# Patient Record
Sex: Male | Born: 1945
Health system: Southern US, Community
[De-identification: ages and names within clinical notes are randomized; demographics above are authoritative.]

## PROBLEM LIST (undated history)

## (undated) DIAGNOSIS — E119 Type 2 diabetes mellitus without complications: Secondary | ICD-10-CM

## (undated) DIAGNOSIS — I10 Essential (primary) hypertension: Secondary | ICD-10-CM

---

## 1998-01-22 ENCOUNTER — Ambulatory Visit (HOSPITAL_COMMUNITY): Admission: RE | Admit: 1998-01-22 | Discharge: 1998-01-22 | Payer: Self-pay | Admitting: Gastroenterology

## 2001-05-13 ENCOUNTER — Emergency Department (HOSPITAL_COMMUNITY): Admission: EM | Admit: 2001-05-13 | Discharge: 2001-05-13 | Payer: Self-pay

## 2001-05-14 ENCOUNTER — Emergency Department (HOSPITAL_COMMUNITY): Admission: EM | Admit: 2001-05-14 | Discharge: 2001-05-14 | Payer: Self-pay | Admitting: Emergency Medicine

## 2005-10-14 ENCOUNTER — Ambulatory Visit (HOSPITAL_COMMUNITY): Admission: RE | Admit: 2005-10-14 | Discharge: 2005-10-14 | Payer: Self-pay | Admitting: Gastroenterology

## 2015-03-13 MED FILL — PIOGLITAZONE HCL 30 MG TAB: 30 | 90 days supply | Qty: 90 | Fill #2

## 2015-03-13 MED FILL — VYTORIN 10-20 MG TABLET: 10-20 | 90 days supply | Qty: 90 | Fill #2

## 2015-03-13 MED FILL — LOSARTAN POTASSIUM 100 MG T: 100 | 90 days supply | Qty: 90 | Fill #2

## 2015-04-08 DIAGNOSIS — Z6825 Body mass index (BMI) 25.0-25.9, adult: Secondary | ICD-10-CM | POA: Diagnosis not present

## 2015-04-08 DIAGNOSIS — I10 Essential (primary) hypertension: Secondary | ICD-10-CM | POA: Diagnosis not present

## 2015-04-08 DIAGNOSIS — E784 Other hyperlipidemia: Secondary | ICD-10-CM | POA: Diagnosis not present

## 2015-04-08 DIAGNOSIS — E11319 Type 2 diabetes mellitus with unspecified diabetic retinopathy without macular edema: Secondary | ICD-10-CM | POA: Diagnosis not present

## 2015-04-08 DIAGNOSIS — Z1389 Encounter for screening for other disorder: Secondary | ICD-10-CM | POA: Diagnosis not present

## 2015-06-09 MED FILL — PIOGLITAZONE HCL 30 MG TAB: 30 | 90 days supply | Qty: 90 | Fill #0

## 2015-06-09 MED FILL — LOSARTAN POTASSIUM 100 MG T: 100 | 90 days supply | Qty: 90 | Fill #0

## 2015-06-10 DIAGNOSIS — D485 Neoplasm of uncertain behavior of skin: Secondary | ICD-10-CM | POA: Diagnosis not present

## 2015-06-10 DIAGNOSIS — L570 Actinic keratosis: Secondary | ICD-10-CM | POA: Diagnosis not present

## 2015-06-10 DIAGNOSIS — D1801 Hemangioma of skin and subcutaneous tissue: Secondary | ICD-10-CM | POA: Diagnosis not present

## 2015-06-10 DIAGNOSIS — D225 Melanocytic nevi of trunk: Secondary | ICD-10-CM | POA: Diagnosis not present

## 2015-06-10 DIAGNOSIS — Z85828 Personal history of other malignant neoplasm of skin: Secondary | ICD-10-CM | POA: Diagnosis not present

## 2015-06-10 DIAGNOSIS — D2361 Other benign neoplasm of skin of right upper limb, including shoulder: Secondary | ICD-10-CM | POA: Diagnosis not present

## 2015-06-10 DIAGNOSIS — C4441 Basal cell carcinoma of skin of scalp and neck: Secondary | ICD-10-CM | POA: Diagnosis not present

## 2015-06-10 MED FILL — EZETIMIBE-SIMVASTATIN 10-20: 10-20 | 90 days supply | Qty: 90 | Fill #0

## 2015-07-04 DIAGNOSIS — H1131 Conjunctival hemorrhage, right eye: Secondary | ICD-10-CM | POA: Diagnosis not present

## 2015-07-04 DIAGNOSIS — H11822 Conjunctivochalasis, left eye: Secondary | ICD-10-CM | POA: Diagnosis not present

## 2015-09-09 MED FILL — LOSARTAN POTASSIUM 100 MG T: 100 | 90 days supply | Qty: 90 | Fill #1

## 2015-09-09 MED FILL — EZETIMIBE-SIMVASTATIN 10-20: 10-20 | 90 days supply | Qty: 90 | Fill #1

## 2015-09-09 MED FILL — PIOGLITAZONE HCL 30 MG TAB: 30 | 90 days supply | Qty: 90 | Fill #1

## 2015-09-30 MED FILL — GAVILYTE-N SOLUTION: 420 | 1 days supply | Qty: 4000 | Fill #0

## 2015-10-07 DIAGNOSIS — Z1211 Encounter for screening for malignant neoplasm of colon: Secondary | ICD-10-CM | POA: Diagnosis not present

## 2015-10-14 DIAGNOSIS — R8299 Other abnormal findings in urine: Secondary | ICD-10-CM | POA: Diagnosis not present

## 2015-10-14 DIAGNOSIS — E1129 Type 2 diabetes mellitus with other diabetic kidney complication: Secondary | ICD-10-CM | POA: Diagnosis not present

## 2015-10-14 DIAGNOSIS — Z0000 Encounter for general adult medical examination without abnormal findings: Secondary | ICD-10-CM | POA: Diagnosis not present

## 2015-10-14 DIAGNOSIS — Z125 Encounter for screening for malignant neoplasm of prostate: Secondary | ICD-10-CM | POA: Diagnosis not present

## 2015-10-18 DIAGNOSIS — Z23 Encounter for immunization: Secondary | ICD-10-CM | POA: Diagnosis not present

## 2015-10-21 DIAGNOSIS — E1129 Type 2 diabetes mellitus with other diabetic kidney complication: Secondary | ICD-10-CM | POA: Diagnosis not present

## 2015-10-21 DIAGNOSIS — R3129 Other microscopic hematuria: Secondary | ICD-10-CM | POA: Diagnosis not present

## 2015-10-21 DIAGNOSIS — E784 Other hyperlipidemia: Secondary | ICD-10-CM | POA: Diagnosis not present

## 2015-10-21 DIAGNOSIS — Z0000 Encounter for general adult medical examination without abnormal findings: Secondary | ICD-10-CM | POA: Diagnosis not present

## 2015-10-21 DIAGNOSIS — I10 Essential (primary) hypertension: Secondary | ICD-10-CM | POA: Diagnosis not present

## 2015-10-21 DIAGNOSIS — R972 Elevated prostate specific antigen [PSA]: Secondary | ICD-10-CM | POA: Diagnosis not present

## 2015-10-21 DIAGNOSIS — Z6824 Body mass index (BMI) 24.0-24.9, adult: Secondary | ICD-10-CM | POA: Diagnosis not present

## 2015-10-21 DIAGNOSIS — N182 Chronic kidney disease, stage 2 (mild): Secondary | ICD-10-CM | POA: Diagnosis not present

## 2015-10-21 MED FILL — TELMISARTAN 80 MG TABLET: 80 | 90 days supply | Qty: 90 | Fill #0

## 2015-12-09 DIAGNOSIS — L570 Actinic keratosis: Secondary | ICD-10-CM | POA: Diagnosis not present

## 2015-12-09 DIAGNOSIS — D2272 Melanocytic nevi of left lower limb, including hip: Secondary | ICD-10-CM | POA: Diagnosis not present

## 2015-12-09 DIAGNOSIS — D2361 Other benign neoplasm of skin of right upper limb, including shoulder: Secondary | ICD-10-CM | POA: Diagnosis not present

## 2015-12-09 DIAGNOSIS — D225 Melanocytic nevi of trunk: Secondary | ICD-10-CM | POA: Diagnosis not present

## 2015-12-09 DIAGNOSIS — L821 Other seborrheic keratosis: Secondary | ICD-10-CM | POA: Diagnosis not present

## 2015-12-09 DIAGNOSIS — Z85828 Personal history of other malignant neoplasm of skin: Secondary | ICD-10-CM | POA: Diagnosis not present

## 2015-12-09 DIAGNOSIS — D1801 Hemangioma of skin and subcutaneous tissue: Secondary | ICD-10-CM | POA: Diagnosis not present

## 2015-12-09 DIAGNOSIS — D2372 Other benign neoplasm of skin of left lower limb, including hip: Secondary | ICD-10-CM | POA: Diagnosis not present

## 2015-12-09 MED FILL — EZETIMIBE-SIMVASTATIN 10-20: 10-20 | 90 days supply | Qty: 90 | Fill #2

## 2015-12-09 MED FILL — PIOGLITAZONE HCL 30 MG TAB: 30 | 90 days supply | Qty: 90 | Fill #0

## 2015-12-18 DIAGNOSIS — H52203 Unspecified astigmatism, bilateral: Secondary | ICD-10-CM | POA: Diagnosis not present

## 2015-12-18 DIAGNOSIS — H524 Presbyopia: Secondary | ICD-10-CM | POA: Diagnosis not present

## 2016-01-07 MED FILL — TELMISARTAN 80 MG TABLET: 80 | 90 days supply | Qty: 90 | Fill #1

## 2016-03-14 MED FILL — EZETIMIBE-SIMVASTATIN 10-20: 10-20 | 90 days supply | Qty: 90 | Fill #3

## 2016-03-14 MED FILL — PIOGLITAZONE HCL 30 MG TAB: 30 | 90 days supply | Qty: 90 | Fill #1

## 2016-03-30 MED FILL — TELMISARTAN 80 MG TABLET: 80 | 90 days supply | Qty: 90 | Fill #2

## 2016-06-03 DIAGNOSIS — I10 Essential (primary) hypertension: Secondary | ICD-10-CM | POA: Diagnosis not present

## 2016-06-03 DIAGNOSIS — E1129 Type 2 diabetes mellitus with other diabetic kidney complication: Secondary | ICD-10-CM | POA: Diagnosis not present

## 2016-06-03 DIAGNOSIS — Z1389 Encounter for screening for other disorder: Secondary | ICD-10-CM | POA: Diagnosis not present

## 2016-06-03 DIAGNOSIS — E784 Other hyperlipidemia: Secondary | ICD-10-CM | POA: Diagnosis not present

## 2016-06-03 DIAGNOSIS — Z6825 Body mass index (BMI) 25.0-25.9, adult: Secondary | ICD-10-CM | POA: Diagnosis not present

## 2016-06-07 MED FILL — EZETIMIBE-SIMVASTATIN 10-20: 10-20 | 90 days supply | Qty: 90 | Fill #0

## 2016-06-07 MED FILL — PIOGLITAZONE HCL 30 MG TAB: 30 | 90 days supply | Qty: 90 | Fill #0

## 2016-06-08 DIAGNOSIS — D2372 Other benign neoplasm of skin of left lower limb, including hip: Secondary | ICD-10-CM | POA: Diagnosis not present

## 2016-06-08 DIAGNOSIS — L821 Other seborrheic keratosis: Secondary | ICD-10-CM | POA: Diagnosis not present

## 2016-06-08 DIAGNOSIS — Z85828 Personal history of other malignant neoplasm of skin: Secondary | ICD-10-CM | POA: Diagnosis not present

## 2016-06-08 DIAGNOSIS — C4441 Basal cell carcinoma of skin of scalp and neck: Secondary | ICD-10-CM | POA: Diagnosis not present

## 2016-06-08 DIAGNOSIS — L570 Actinic keratosis: Secondary | ICD-10-CM | POA: Diagnosis not present

## 2016-06-08 DIAGNOSIS — D1801 Hemangioma of skin and subcutaneous tissue: Secondary | ICD-10-CM | POA: Diagnosis not present

## 2016-06-08 DIAGNOSIS — C44212 Basal cell carcinoma of skin of right ear and external auricular canal: Secondary | ICD-10-CM | POA: Diagnosis not present

## 2016-06-08 DIAGNOSIS — D485 Neoplasm of uncertain behavior of skin: Secondary | ICD-10-CM | POA: Diagnosis not present

## 2016-06-10 MED FILL — TELMISARTAN 80 MG TABLET: 80 | 90 days supply | Qty: 90 | Fill #3

## 2016-07-08 DIAGNOSIS — C44212 Basal cell carcinoma of skin of right ear and external auricular canal: Secondary | ICD-10-CM | POA: Diagnosis not present

## 2016-07-08 DIAGNOSIS — Z85828 Personal history of other malignant neoplasm of skin: Secondary | ICD-10-CM | POA: Diagnosis not present

## 2016-07-08 MED FILL — DOXYCYCLINE HYCLATE 100 MG: 100 | 5 days supply | Qty: 10 | Fill #0

## 2016-09-13 MED FILL — PIOGLITAZONE HCL 30 MG TAB: 30 | 90 days supply | Qty: 90 | Fill #1

## 2016-09-13 MED FILL — EZETIMIBE-SIMVASTATIN 10-20: 10-20 | 90 days supply | Qty: 90 | Fill #1

## 2016-09-13 MED FILL — TELMISARTAN 80 MG TABLET: 80 | 90 days supply | Qty: 90 | Fill #0

## 2016-10-02 DIAGNOSIS — Z23 Encounter for immunization: Secondary | ICD-10-CM | POA: Diagnosis not present

## 2016-10-18 DIAGNOSIS — E119 Type 2 diabetes mellitus without complications: Secondary | ICD-10-CM | POA: Diagnosis not present

## 2016-10-18 DIAGNOSIS — I10 Essential (primary) hypertension: Secondary | ICD-10-CM | POA: Diagnosis not present

## 2016-10-18 DIAGNOSIS — Z125 Encounter for screening for malignant neoplasm of prostate: Secondary | ICD-10-CM | POA: Diagnosis not present

## 2016-10-18 DIAGNOSIS — Z0000 Encounter for general adult medical examination without abnormal findings: Secondary | ICD-10-CM | POA: Diagnosis not present

## 2016-10-26 DIAGNOSIS — E7849 Other hyperlipidemia: Secondary | ICD-10-CM | POA: Diagnosis not present

## 2016-10-26 DIAGNOSIS — Z1389 Encounter for screening for other disorder: Secondary | ICD-10-CM | POA: Diagnosis not present

## 2016-10-26 DIAGNOSIS — N182 Chronic kidney disease, stage 2 (mild): Secondary | ICD-10-CM | POA: Diagnosis not present

## 2016-10-26 DIAGNOSIS — Z6825 Body mass index (BMI) 25.0-25.9, adult: Secondary | ICD-10-CM | POA: Diagnosis not present

## 2016-10-26 DIAGNOSIS — R972 Elevated prostate specific antigen [PSA]: Secondary | ICD-10-CM | POA: Diagnosis not present

## 2016-10-26 DIAGNOSIS — I10 Essential (primary) hypertension: Secondary | ICD-10-CM | POA: Diagnosis not present

## 2016-10-26 DIAGNOSIS — Z0000 Encounter for general adult medical examination without abnormal findings: Secondary | ICD-10-CM | POA: Diagnosis not present

## 2016-10-26 DIAGNOSIS — E1129 Type 2 diabetes mellitus with other diabetic kidney complication: Secondary | ICD-10-CM | POA: Diagnosis not present

## 2016-10-26 MED FILL — CIPROFLOXACIN HCL 500 MG TA: 500 | 21 days supply | Qty: 42 | Fill #0

## 2016-10-27 DIAGNOSIS — Z13820 Encounter for screening for osteoporosis: Secondary | ICD-10-CM | POA: Diagnosis not present

## 2016-11-02 DIAGNOSIS — Z1212 Encounter for screening for malignant neoplasm of rectum: Secondary | ICD-10-CM | POA: Diagnosis not present

## 2016-11-15 DIAGNOSIS — R972 Elevated prostate specific antigen [PSA]: Secondary | ICD-10-CM | POA: Diagnosis not present

## 2016-11-22 MED FILL — VIT D2 1.25 MG (50,000 UNIT: 1.25 MG | 28 days supply | Qty: 4 | Fill #0

## 2016-11-24 DIAGNOSIS — H903 Sensorineural hearing loss, bilateral: Secondary | ICD-10-CM | POA: Diagnosis not present

## 2016-11-26 DIAGNOSIS — H903 Sensorineural hearing loss, bilateral: Secondary | ICD-10-CM | POA: Diagnosis not present

## 2016-12-13 MED FILL — PIOGLITAZONE HCL 30 MG TAB: 30 | 90 days supply | Qty: 90 | Fill #2

## 2016-12-13 MED FILL — EZETIMIBE-SIMVASTATIN 10-20: 10-20 | 90 days supply | Qty: 90 | Fill #2

## 2016-12-13 MED FILL — TELMISARTAN 80 MG TABLET: 80 | 60 days supply | Qty: 60 | Fill #1

## 2016-12-14 DIAGNOSIS — D1801 Hemangioma of skin and subcutaneous tissue: Secondary | ICD-10-CM | POA: Diagnosis not present

## 2016-12-14 DIAGNOSIS — C44612 Basal cell carcinoma of skin of right upper limb, including shoulder: Secondary | ICD-10-CM | POA: Diagnosis not present

## 2016-12-14 DIAGNOSIS — L570 Actinic keratosis: Secondary | ICD-10-CM | POA: Diagnosis not present

## 2016-12-14 DIAGNOSIS — D485 Neoplasm of uncertain behavior of skin: Secondary | ICD-10-CM | POA: Diagnosis not present

## 2016-12-14 DIAGNOSIS — D225 Melanocytic nevi of trunk: Secondary | ICD-10-CM | POA: Diagnosis not present

## 2016-12-14 DIAGNOSIS — L821 Other seborrheic keratosis: Secondary | ICD-10-CM | POA: Diagnosis not present

## 2016-12-14 DIAGNOSIS — Z85828 Personal history of other malignant neoplasm of skin: Secondary | ICD-10-CM | POA: Diagnosis not present

## 2016-12-14 DIAGNOSIS — C44519 Basal cell carcinoma of skin of other part of trunk: Secondary | ICD-10-CM | POA: Diagnosis not present

## 2016-12-14 MED FILL — VIT D2 1.25 MG (50,000 UNIT: 1.25 MG | 28 days supply | Qty: 4 | Fill #1

## 2016-12-23 DIAGNOSIS — R972 Elevated prostate specific antigen [PSA]: Secondary | ICD-10-CM | POA: Diagnosis not present

## 2016-12-23 DIAGNOSIS — R3915 Urgency of urination: Secondary | ICD-10-CM | POA: Diagnosis not present

## 2016-12-23 MED FILL — SULFAMETHOXAZOLE/TMP DS TAB: 800-160 | 3 days supply | Qty: 6 | Fill #0

## 2017-01-03 DIAGNOSIS — H52203 Unspecified astigmatism, bilateral: Secondary | ICD-10-CM | POA: Diagnosis not present

## 2017-01-12 MED FILL — VIT D2 1.25 MG (50,000 UNIT: 1.25 MG | 84 days supply | Qty: 12 | Fill #0

## 2017-01-24 DIAGNOSIS — R972 Elevated prostate specific antigen [PSA]: Secondary | ICD-10-CM | POA: Diagnosis not present

## 2017-01-24 DIAGNOSIS — R3915 Urgency of urination: Secondary | ICD-10-CM | POA: Diagnosis not present

## 2017-01-31 MED FILL — FINASTERIDE 5 MG TABLET: 5 | 90 days supply | Qty: 90 | Fill #0

## 2017-03-04 MED FILL — PIOGLITAZONE HCL 30 MG TAB: 30 | 90 days supply | Qty: 90 | Fill #3

## 2017-03-04 MED FILL — TELMISARTAN 80 MG TABS: 80 | 90 days supply | Qty: 90 | Fill #2

## 2017-03-04 MED FILL — EZETIMIBE-SIMVASTATIN 10-20: 10-20 | 90 days supply | Qty: 90 | Fill #3

## 2017-04-06 MED FILL — VIT D2 1.25 MG (50,000 UNIT: 1.25 MG | 84 days supply | Qty: 12 | Fill #1

## 2017-04-25 DIAGNOSIS — E11319 Type 2 diabetes mellitus with unspecified diabetic retinopathy without macular edema: Secondary | ICD-10-CM | POA: Diagnosis not present

## 2017-04-25 DIAGNOSIS — I10 Essential (primary) hypertension: Secondary | ICD-10-CM | POA: Diagnosis not present

## 2017-04-25 DIAGNOSIS — Z1389 Encounter for screening for other disorder: Secondary | ICD-10-CM | POA: Diagnosis not present

## 2017-04-25 DIAGNOSIS — Z6825 Body mass index (BMI) 25.0-25.9, adult: Secondary | ICD-10-CM | POA: Diagnosis not present

## 2017-04-25 DIAGNOSIS — E210 Primary hyperparathyroidism: Secondary | ICD-10-CM | POA: Diagnosis not present

## 2017-04-29 MED FILL — FINASTERIDE 5 MG TABLET: 5 | 90 days supply | Qty: 90 | Fill #1

## 2017-05-30 DIAGNOSIS — E1129 Type 2 diabetes mellitus with other diabetic kidney complication: Secondary | ICD-10-CM | POA: Diagnosis not present

## 2017-06-01 MED FILL — EZETIMIBE-SIMVASTATIN 10-20: 10-20 | 90 days supply | Qty: 90 | Fill #0

## 2017-06-01 MED FILL — TELMISARTAN 80 MG TABS: 80 | 90 days supply | Qty: 90 | Fill #3

## 2017-06-01 MED FILL — PIOGLITAZONE HCL 30 MG TAB: 30 | 90 days supply | Qty: 90 | Fill #0

## 2017-06-02 DIAGNOSIS — Z85828 Personal history of other malignant neoplasm of skin: Secondary | ICD-10-CM | POA: Diagnosis not present

## 2017-06-02 DIAGNOSIS — D2372 Other benign neoplasm of skin of left lower limb, including hip: Secondary | ICD-10-CM | POA: Diagnosis not present

## 2017-06-02 DIAGNOSIS — D485 Neoplasm of uncertain behavior of skin: Secondary | ICD-10-CM | POA: Diagnosis not present

## 2017-06-02 DIAGNOSIS — L821 Other seborrheic keratosis: Secondary | ICD-10-CM | POA: Diagnosis not present

## 2017-06-02 DIAGNOSIS — D2272 Melanocytic nevi of left lower limb, including hip: Secondary | ICD-10-CM | POA: Diagnosis not present

## 2017-06-02 DIAGNOSIS — L570 Actinic keratosis: Secondary | ICD-10-CM | POA: Diagnosis not present

## 2017-06-02 DIAGNOSIS — D225 Melanocytic nevi of trunk: Secondary | ICD-10-CM | POA: Diagnosis not present

## 2017-06-02 DIAGNOSIS — D1801 Hemangioma of skin and subcutaneous tissue: Secondary | ICD-10-CM | POA: Diagnosis not present

## 2017-06-02 MED FILL — IMIQUIMOD 5% CREAM PACKET: 5 | 24 days supply | Qty: 12 | Fill #0

## 2017-06-27 MED FILL — VIT D2 1.25 MG (50,000 UNIT: 1.25 MG | 84 days supply | Qty: 12 | Fill #2

## 2017-07-28 MED FILL — FINASTERIDE 5 MG TABLET: 5 | 90 days supply | Qty: 90 | Fill #2

## 2017-08-17 DIAGNOSIS — E210 Primary hyperparathyroidism: Secondary | ICD-10-CM | POA: Diagnosis not present

## 2017-08-17 DIAGNOSIS — E559 Vitamin D deficiency, unspecified: Secondary | ICD-10-CM | POA: Diagnosis not present

## 2017-08-18 ENCOUNTER — Other Ambulatory Visit (HOSPITAL_COMMUNITY): Payer: Self-pay | Admitting: Surgery

## 2017-08-18 DIAGNOSIS — E210 Primary hyperparathyroidism: Secondary | ICD-10-CM

## 2017-08-18 DIAGNOSIS — E559 Vitamin D deficiency, unspecified: Secondary | ICD-10-CM

## 2017-08-31 ENCOUNTER — Encounter (HOSPITAL_COMMUNITY)
Admission: RE | Admit: 2017-08-31 | Discharge: 2017-08-31 | Disposition: A | Discharge status: Home / Self Care | Payer: 59 | Source: Ambulatory Visit | Attending: Surgery | Admitting: Surgery

## 2017-08-31 ENCOUNTER — Ambulatory Visit (HOSPITAL_COMMUNITY)
Admission: RE | Admit: 2017-08-31 | Discharge: 2017-08-31 | Disposition: A | Discharge status: Home / Self Care | Payer: 59 | Source: Ambulatory Visit | Attending: Surgery | Admitting: Surgery

## 2017-08-31 DIAGNOSIS — E210 Primary hyperparathyroidism: Secondary | ICD-10-CM | POA: Insufficient documentation

## 2017-08-31 DIAGNOSIS — E213 Hyperparathyroidism, unspecified: Secondary | ICD-10-CM | POA: Diagnosis not present

## 2017-08-31 DIAGNOSIS — E559 Vitamin D deficiency, unspecified: Secondary | ICD-10-CM

## 2017-08-31 DIAGNOSIS — E042 Nontoxic multinodular goiter: Secondary | ICD-10-CM | POA: Diagnosis not present

## 2017-08-31 MED ORDER — TECHNETIUM TC 99M SESTAMIBI - CARDIOLITE
25.0000 | Freq: Once | INTRAVENOUS | Status: AC | PRN
  Administered 2017-08-31: 25 via INTRAVENOUS

## 2017-09-06 MED FILL — EZETIMIBE-SIMVASTATIN 10-20: 10-20 | 90 days supply | Qty: 90 | Fill #1

## 2017-09-06 MED FILL — PIOGLITAZONE HCL 30 MG TAB: 30 | 90 days supply | Qty: 90 | Fill #1

## 2017-09-06 MED FILL — TELMISARTAN 80 MG TABS: 80 | 90 days supply | Qty: 90 | Fill #0

## 2017-09-07 MED FILL — VIT D2 1.25 MG (50,000 UNIT: 1.25 MG | 84 days supply | Qty: 12 | Fill #3

## 2017-09-08 ENCOUNTER — Other Ambulatory Visit: Payer: Self-pay | Admitting: Surgery

## 2017-09-08 DIAGNOSIS — E041 Nontoxic single thyroid nodule: Secondary | ICD-10-CM

## 2017-09-15 ENCOUNTER — Ambulatory Visit
Admission: RE | Admit: 2017-09-15 | Discharge: 2017-09-15 | Disposition: A | Discharge status: Home / Self Care | Payer: 59 | Source: Ambulatory Visit | Attending: Surgery | Admitting: Surgery

## 2017-09-15 ENCOUNTER — Other Ambulatory Visit (HOSPITAL_COMMUNITY)
Admission: RE | Admit: 2017-09-15 | Discharge: 2017-09-15 | Disposition: A | Discharge status: Home / Self Care | Payer: 59 | Source: Ambulatory Visit | Attending: Radiology | Admitting: Radiology

## 2017-09-15 DIAGNOSIS — E041 Nontoxic single thyroid nodule: Secondary | ICD-10-CM

## 2017-09-29 ENCOUNTER — Ambulatory Visit: Payer: Self-pay | Admitting: Surgery

## 2017-10-01 DIAGNOSIS — Z23 Encounter for immunization: Secondary | ICD-10-CM | POA: Diagnosis not present

## 2017-10-12 MED FILL — FINASTERIDE 5 MG TABLET: 5 | 90 days supply | Qty: 90 | Fill #3

## 2017-10-26 DIAGNOSIS — E210 Primary hyperparathyroidism: Secondary | ICD-10-CM | POA: Diagnosis not present

## 2017-11-06 ENCOUNTER — Encounter (HOSPITAL_COMMUNITY): Payer: Self-pay | Admitting: Surgery

## 2017-11-06 DIAGNOSIS — E210 Primary hyperparathyroidism: Secondary | ICD-10-CM | POA: Diagnosis present

## 2017-11-06 NOTE — H&P (Signed)
General Surgery [facility]  Chrystine Oiler Documented: 10/26/2017 11:25 AM Location: [facility] Patient #: [id number] DOB: Jan 18, 1946 Married / Language: Cleophus Molt / Race: White Male   History of Present Illness Earnstine Regal MD; 10/26/2017 11:56 AM) The patient is a 72 year old male who presents with primary hyperparathyroidism.  CHIEF COMPLAINT: primary hyperparathyroidism  Patient returns to the office to review his test results and to discuss surgery. Patient underwent thyroid ultrasound which demonstrated a 1 cm thyroid nodule in the left thyroid lobe. Biopsy was recommended. Biopsy was subsequently performed and showed a benign follicular nodule. 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 We plan right inferior minimally invasive parathyroidectomy. Patient has been on the Internet and has been reviewing various websites including the surgical group in [place]. He has many questions today to discuss with me.   Problem List/Past Medical Earnstine Regal, MD; 10/26/2017 11:58 AM) VITAMIN D INSUFFICIENCY (E55.9)  PRIMARY HYPERPARATHYROIDISM (E21.0)   Past Surgical History Earnstine Regal, MD; 10/26/2017 11:58 AM) No pertinent past surgical history   Diagnostic Studies History Earnstine Regal, MD; 10/26/2017 11:58 AM) Colonoscopy  1-5 years ago  Allergies Mammie Lorenzo, LPN; 15/04/84 76:19 AM) No Known Drug Allergies [08/17/2017]:  Medication History Mammie Lorenzo, LPN; 50/09/3265 12:45 AM) Telmisartan (80MG  Tablet, Oral) Active. Vitamin D (Ergocalciferol) (50000UNIT Capsule, Oral) Active. Vytorin (10-20MG  Tablet, Oral) Active. Actos (30MG  Tablet, Oral) Active. Aspirin (81MG  Tablet, Oral) Active. Medications Reconciled  Other Problems Earnstine Regal, MD; 10/26/2017 11:58 AM) Diabetes Mellitus  High blood pressure  Hypercholesterolemia   Vitals Claiborne Billings Dockery  LPN; 80/09/9831 82:50 AM) 10/26/2017 11:25 AM Weight: 175.8 lb Height: 68in Body Surface Area: 1.93 m Body Mass Index: 26.73 kg/m  Temp.: 97.49F(Temporal)  BP: 118/68 (Sitting, Left Arm, Standard)       Physical Exam Earnstine Regal MD; 10/26/2017 11:56 AM) The physical exam findings are as follows: Note:Limited examination  Anterior neck is soft and symmetric. No palpable abnormalities. No palpable thyroid nodules. No tenderness. No sign of complication from fine-needle aspiration biopsy.    Assessment & Plan Earnstine Regal MD; 10/26/2017 11:58 AM) PRIMARY HYPERPARATHYROIDISM (E21.0) Current Plans Instructed to keep surgical appointment as scheduled  The patient and I reviewed all of his questions today in detail. We reviewed the results of his fine-needle aspiration biopsy and also of his nuclear medicine parathyroid scan. I provided him with a copy of his biopsy report.  I plan right inferior minimally invasive parathyroidectomy. We will not explore the other parathyroid glands if we find a parathyroid adenoma in the right inferior position as suggested by the nuclear medicine scan. We will plan for outpatient surgery. We will test his PTH level and calcium level approximately 3 weeks following surgery to make sure they have returned to the normal range. There is a 95% chance that the patient has single gland disease.  Patient will proceed with surgery later this month.  Armandina Gemma, [facility] Office: [phone]

## 2017-11-07 NOTE — Patient Instructions (Addendum)
Kenneth Chavez  11/07/2017   Your procedure is scheduled on: 11-10-17     Report to [facility] Main  Entrance    Report to Admitting at 6:30 AM    Call this number if you have problems the morning of surgery [phone]     Remember: Do not eat food or drink liquids :After Midnight.      BRUSH YOUR TEETH MORNING OF SURGERY AND RINSE YOUR MOUTH OUT, NO CHEWING GUM CANDY OR MINTS.     Take these medicines the morning of surgery with A SIP OF WATER: Vytorin (Ezetimibe-Simvastatin)   DO NOT TAKE ANY DIABETIC MEDICATIONS DAY OF YOUR SURGERY                               You may not have any metal on your body including hair pins and              piercings  Do not wear jewelry, lotions, powders, cologne, or deodorant             Men may shave face and neck.   Do not bring valuables to the hospital. [place].  Contacts, dentures or bridgework may not be worn into surgery.      Patients discharged the day of surgery will not be allowed to drive home.  Name and phone number of your driver:Kenneth Chavez [phone]  Special Instructions: N/A              Please read over the following fact sheets you were given: _____________________________________________________________________ How to Manage Your Diabetes Before and After Surgery  Why is it important to control my blood sugar before and after surgery? . Improving blood sugar levels before and after surgery helps healing and can limit problems. . A way of improving blood sugar control is eating a healthy diet by: o  Eating less sugar and carbohydrates o  Increasing activity/exercise o  Talking with your doctor about reaching your blood sugar goals . High blood sugars (greater than 180 mg/dL) can raise your risk of infections and slow your recovery, so you will need to focus on controlling your diabetes during the weeks before surgery. . Make sure  that the doctor who takes care of your diabetes knows about your planned surgery including the date and location.  How do I manage my blood sugar before surgery? . Check your blood sugar at least 4 times a day, starting 2 days before surgery, to make sure that the level is not too high or low. o Check your blood sugar the morning of your surgery when you wake up and every 2 hours until you get to the Short Stay unit. . If your blood sugar is less than 70 mg/dL, you will need to treat for low blood sugar: o Do not take insulin. o Treat a low blood sugar (less than 70 mg/dL) with  cup of clear juice (cranberry or apple), 4 glucose tablets, OR glucose gel. o Recheck blood sugar in 15 minutes after treatment (to make sure it is greater than 70 mg/dL). If your blood sugar is not greater than 70 mg/dL on recheck, call [phone] for further instructions. . Report your blood sugar to the short stay nurse  when you get to Short Stay.  . If you are admitted to the hospital after surgery: o Your blood sugar will be checked by the staff and you will probably be given insulin after surgery (instead of oral diabetes medicines) to make sure you have good blood sugar levels. o The goal for blood sugar control after surgery is 80-180 mg/dL.   WHAT DO I DO ABOUT MY DIABETES MEDICATION?  Marland Kitchen Do not take oral diabetes medicines (pills) the morning of surgery.  . THE DAY BEFORE SURGERY, take your usual dose of Actos                 [facility] - Preparing for Surgery Before surgery, you can play an important role.  Because skin is not sterile, your skin needs to be as free of germs as possible.  You can reduce the number of germs on your skin by washing with CHG (chlorahexidine gluconate) soap before surgery.  CHG is an antiseptic cleaner which kills germs and bonds with the skin to continue killing germs even after washing. Please DO NOT use if you have an allergy to CHG or antibacterial soaps.  If your skin  becomes reddened/irritated stop using the CHG and inform your nurse when you arrive at Short Stay. Do not shave (including legs and underarms) for at least 48 hours prior to the first CHG shower.  You may shave your face/neck. Please follow these instructions carefully:  1.  Shower with CHG Soap the night before surgery and the  morning of Surgery.  2.  If you choose to wash your hair, wash your hair first as usual with your  normal  shampoo.  3.  After you shampoo, rinse your hair and body thoroughly to remove the  shampoo.                           4.  Use CHG as you would any other liquid soap.  You can apply chg directly  to the skin and wash                       Gently with a scrungie or clean washcloth.  5.  Apply the CHG Soap to your body ONLY FROM THE NECK DOWN.   Do not use on face/ open                           Wound or open sores. Avoid contact with eyes, ears mouth and genitals (private parts).                       Wash face,  Genitals (private parts) with your normal soap.             6.  Wash thoroughly, paying special attention to the area where your surgery  will be performed.  7.  Thoroughly rinse your body with warm water from the neck down.  8.  DO NOT shower/wash with your normal soap after using and rinsing off  the CHG Soap.                9.  Pat yourself dry with a clean towel.            10.  Wear clean pajamas.            11.  Place clean sheets on your bed the  night of your first shower and do not  sleep with pets. Day of Surgery : Do not apply any lotions/deodorants the morning of surgery.  Please wear clean clothes to the hospital/surgery center.  FAILURE TO FOLLOW THESE INSTRUCTIONS MAY RESULT IN THE CANCELLATION OF YOUR SURGERY PATIENT SIGNATURE_________________________________  NURSE SIGNATURE__________________________________  ________________________________________________________________________

## 2017-11-08 ENCOUNTER — Encounter (HOSPITAL_COMMUNITY): Payer: Self-pay

## 2017-11-08 ENCOUNTER — Ambulatory Visit (HOSPITAL_COMMUNITY)
Admission: RE | Admit: 2017-11-08 | Discharge: 2017-11-08 | Disposition: A | Discharge status: Home / Self Care | Payer: 59 | Source: Ambulatory Visit | Attending: Anesthesiology | Admitting: Anesthesiology

## 2017-11-08 ENCOUNTER — Encounter (HOSPITAL_COMMUNITY)
Admission: RE | Admit: 2017-11-08 | Discharge: 2017-11-08 | Disposition: A | Discharge status: Home / Self Care | Payer: 59 | Source: Ambulatory Visit | Attending: Surgery | Admitting: Surgery

## 2017-11-08 ENCOUNTER — Other Ambulatory Visit: Payer: Self-pay

## 2017-11-08 DIAGNOSIS — E119 Type 2 diabetes mellitus without complications: Secondary | ICD-10-CM | POA: Diagnosis not present

## 2017-11-08 DIAGNOSIS — Z01818 Encounter for other preprocedural examination: Secondary | ICD-10-CM | POA: Diagnosis not present

## 2017-11-08 HISTORY — DX: Type 2 diabetes mellitus without complications: E11.9

## 2017-11-08 HISTORY — DX: Essential (primary) hypertension: I10

## 2017-11-08 LAB — CBC
HEMATOCRIT: 46.2 % (ref 39.0–52.0)
Hemoglobin: 14.1 g/dL (ref 13.0–17.0)
MCH: 29.3 pg (ref 26.0–34.0)
MCHC: 30.5 g/dL (ref 30.0–36.0)
MCV: 96 fL (ref 80.0–100.0)
PLATELETS: 128 10*3/uL — AB (ref 150–400)
RBC: 4.81 MIL/uL (ref 4.22–5.81)
RDW: 14.2 % (ref 11.5–15.5)
WBC: 6.3 10*3/uL (ref 4.0–10.5)
nRBC: 0 % (ref 0.0–0.2)

## 2017-11-08 LAB — BASIC METABOLIC PANEL
Anion gap: 6 (ref 5–15)
BUN: 24 mg/dL — AB (ref 8–23)
CALCIUM: 10.4 mg/dL — AB (ref 8.9–10.3)
CHLORIDE: 106 mmol/L (ref 98–111)
CO2: 27 mmol/L (ref 22–32)
CREATININE: 0.93 mg/dL (ref 0.61–1.24)
GFR calc Af Amer: >60 mL/min (ref >60)
GFR calc non Af Amer: >60 mL/min (ref >60)
GLUCOSE: 106 mg/dL — AB (ref 70–99)
Potassium: 5.1 mmol/L (ref 3.5–5.1)
Sodium: 139 mmol/L (ref 135–145)

## 2017-11-08 LAB — GLUCOSE, CAPILLARY: Glucose-Capillary: 91 mg/dL (ref 70–99)

## 2017-11-08 NOTE — Progress Notes (Signed)
11-08-17 CBC and BMP results routed to Dr. Harlow Asa for review

## 2017-11-09 ENCOUNTER — Encounter (HOSPITAL_COMMUNITY): Payer: Self-pay | Admitting: Anesthesiology

## 2017-11-09 DIAGNOSIS — Z125 Encounter for screening for malignant neoplasm of prostate: Secondary | ICD-10-CM | POA: Diagnosis not present

## 2017-11-09 DIAGNOSIS — E1129 Type 2 diabetes mellitus with other diabetic kidney complication: Secondary | ICD-10-CM | POA: Diagnosis not present

## 2017-11-09 DIAGNOSIS — Z0000 Encounter for general adult medical examination without abnormal findings: Secondary | ICD-10-CM | POA: Diagnosis not present

## 2017-11-09 DIAGNOSIS — R82998 Other abnormal findings in urine: Secondary | ICD-10-CM | POA: Diagnosis not present

## 2017-11-09 LAB — HEMOGLOBIN A1C
HEMOGLOBIN A1C: 6.5 % — AB (ref 4.8–5.6)
Mean Plasma Glucose: 140 mg/dL

## 2017-11-09 NOTE — Anesthesia Preprocedure Evaluation (Addendum)
Anesthesia Evaluation  Patient identified by MRN, date of birth, ID band Patient awake    Reviewed: Allergy & Precautions, NPO status , Patient's Chart, lab work & pertinent test results  Airway Mallampati: II  TM Distance: >3 FB Neck ROM: Full    Dental no notable dental hx. (+) Teeth Intact, Caps   Pulmonary neg pulmonary ROS,    Pulmonary exam normal breath sounds clear to auscultation       Cardiovascular hypertension, Pt. on medications Normal cardiovascular exam Rhythm:Regular Rate:Normal     Neuro/Psych negative neurological ROS  negative psych ROS   GI/Hepatic negative GI ROS, Neg liver ROS,   Endo/Other  diabetes, Well Controlled, Type 2, Oral Hypoglycemic AgentsPrimary hyperparathyroidism Hyperlipidemia  Renal/GU negative Renal ROS  negative genitourinary   Musculoskeletal negative musculoskeletal ROS (+)   Abdominal   Peds  Hematology   Anesthesia Other Findings   Reproductive/Obstetrics                            Anesthesia Physical Anesthesia Plan  ASA: II  Anesthesia Plan: General   Post-op Pain Management:    Induction:   PONV Risk Score and Plan: 3 and Ondansetron, Dexamethasone and Treatment may vary due to age or medical condition  Airway Management Planned: Oral ETT  Additional Equipment:   Intra-op Plan:   Post-operative Plan: Extubation in OR  Informed Consent: I have reviewed the patients History and Physical, chart, labs and discussed the procedure including the risks, benefits and alternatives for the proposed anesthesia with the patient or authorized representative who has indicated his/her understanding and acceptance.   Dental advisory given  Plan Discussed with: CRNA and Surgeon  Anesthesia Plan Comments: (No versed)       Anesthesia Quick Evaluation

## 2017-11-10 ENCOUNTER — Encounter (HOSPITAL_COMMUNITY): Payer: Self-pay | Admitting: Emergency Medicine

## 2017-11-10 ENCOUNTER — Ambulatory Visit (HOSPITAL_COMMUNITY)
Admission: RE | Admit: 2017-11-10 | Discharge: 2017-11-10 | Disposition: A | Discharge status: Home / Self Care | Payer: 59 | Source: Other Acute Inpatient Hospital | Attending: Surgery | Admitting: Surgery

## 2017-11-10 ENCOUNTER — Encounter (HOSPITAL_COMMUNITY)
Admission: RE | Disposition: A | Discharge status: Home / Self Care | Payer: Self-pay | Source: Other Acute Inpatient Hospital | Attending: Surgery

## 2017-11-10 ENCOUNTER — Ambulatory Visit (HOSPITAL_COMMUNITY): Payer: 59 | Admitting: Anesthesiology

## 2017-11-10 DIAGNOSIS — E559 Vitamin D deficiency, unspecified: Secondary | ICD-10-CM | POA: Diagnosis not present

## 2017-11-10 DIAGNOSIS — E210 Primary hyperparathyroidism: Secondary | ICD-10-CM | POA: Insufficient documentation

## 2017-11-10 DIAGNOSIS — Z7984 Long term (current) use of oral hypoglycemic drugs: Secondary | ICD-10-CM | POA: Diagnosis not present

## 2017-11-10 DIAGNOSIS — E785 Hyperlipidemia, unspecified: Secondary | ICD-10-CM | POA: Diagnosis not present

## 2017-11-10 DIAGNOSIS — I10 Essential (primary) hypertension: Secondary | ICD-10-CM | POA: Diagnosis not present

## 2017-11-10 DIAGNOSIS — E119 Type 2 diabetes mellitus without complications: Secondary | ICD-10-CM | POA: Diagnosis not present

## 2017-11-10 DIAGNOSIS — D351 Benign neoplasm of parathyroid gland: Secondary | ICD-10-CM | POA: Diagnosis not present

## 2017-11-10 DIAGNOSIS — E7800 Pure hypercholesterolemia, unspecified: Secondary | ICD-10-CM | POA: Insufficient documentation

## 2017-11-10 HISTORY — PX: PARATHYROIDECTOMY: SHX19

## 2017-11-10 LAB — GLUCOSE, CAPILLARY
Glucose-Capillary: 125 mg/dL — ABNORMAL HIGH (ref 70–99)
Glucose-Capillary: 135 mg/dL — ABNORMAL HIGH (ref 70–99)

## 2017-11-10 SURGERY — PARATHYROIDECTOMY
Anesthesia: General | Laterality: Right

## 2017-11-10 MED ORDER — LACTATED RINGERS IV SOLN
INTRAVENOUS | Status: DC
  Administered 2017-11-10: 07:00:00 via INTRAVENOUS

## 2017-11-10 MED ORDER — PROPOFOL 10 MG/ML IV BOLUS
INTRAVENOUS | Status: AC
  Filled 2017-11-10: qty 20

## 2017-11-10 MED ORDER — SUCCINYLCHOLINE CHLORIDE 200 MG/10ML IV SOSY
PREFILLED_SYRINGE | INTRAVENOUS | Status: AC
  Filled 2017-11-10: qty 10

## 2017-11-10 MED ORDER — ROCURONIUM BROMIDE 100 MG/10ML IV SOLN
INTRAVENOUS | Status: AC
  Filled 2017-11-10: qty 1

## 2017-11-10 MED ORDER — DEXAMETHASONE SODIUM PHOSPHATE 10 MG/ML IJ SOLN
INTRAMUSCULAR | Status: DC | PRN
  Administered 2017-11-10: 10 mg via INTRAVENOUS

## 2017-11-10 MED ORDER — LIDOCAINE 2% (20 MG/ML) 5 ML SYRINGE
INTRAMUSCULAR | Status: DC | PRN
  Administered 2017-11-10: 80 mg via INTRAVENOUS

## 2017-11-10 MED ORDER — PROPOFOL 10 MG/ML IV BOLUS
INTRAVENOUS | Status: DC | PRN
  Administered 2017-11-10: 150 mg via INTRAVENOUS

## 2017-11-10 MED ORDER — EPHEDRINE 5 MG/ML INJ
INTRAVENOUS | Status: AC
  Filled 2017-11-10: qty 10

## 2017-11-10 MED ORDER — 0.9 % SODIUM CHLORIDE (POUR BTL) OPTIME
TOPICAL | Status: DC | PRN
  Administered 2017-11-10: 1000 mL

## 2017-11-10 MED ORDER — CEFAZOLIN SODIUM-DEXTROSE 2-4 GM/100ML-% IV SOLN
2.0000 g | INTRAVENOUS | Status: AC
  Administered 2017-11-10: 2 g via INTRAVENOUS
  Filled 2017-11-10: qty 100

## 2017-11-10 MED ORDER — SUGAMMADEX SODIUM 200 MG/2ML IV SOLN
INTRAVENOUS | Status: DC | PRN
  Administered 2017-11-10: 200 mg via INTRAVENOUS

## 2017-11-10 MED ORDER — FENTANYL CITRATE (PF) 100 MCG/2ML IJ SOLN
INTRAMUSCULAR | Status: DC | PRN
  Administered 2017-11-10: 100 ug via INTRAVENOUS
  Administered 2017-11-10: 50 ug via INTRAVENOUS

## 2017-11-10 MED ORDER — ONDANSETRON HCL 4 MG/2ML IJ SOLN: 4.0000 mg | Freq: Once | INTRAMUSCULAR | Status: DC | PRN

## 2017-11-10 MED ORDER — HYDROCODONE-ACETAMINOPHEN 7.5-325 MG PO TABS: 1.0000 | ORAL_TABLET | Freq: Once | ORAL | Status: DC | PRN

## 2017-11-10 MED ORDER — BUPIVACAINE HCL 0.25 % IJ SOLN
INTRAMUSCULAR | Status: DC | PRN
  Administered 2017-11-10: 10 mL

## 2017-11-10 MED ORDER — ONDANSETRON HCL 4 MG/2ML IJ SOLN
INTRAMUSCULAR | Status: DC | PRN
  Administered 2017-11-10: 4 mg via INTRAVENOUS

## 2017-11-10 MED ORDER — SUGAMMADEX SODIUM 200 MG/2ML IV SOLN
INTRAVENOUS | Status: AC
  Filled 2017-11-10: qty 2

## 2017-11-10 MED ORDER — FENTANYL CITRATE (PF) 100 MCG/2ML IJ SOLN: 25.0000 ug | INTRAMUSCULAR | Status: DC | PRN

## 2017-11-10 MED ORDER — CHLORHEXIDINE GLUCONATE CLOTH 2 % EX PADS: 6.0000 | MEDICATED_PAD | Freq: Once | CUTANEOUS | Status: DC

## 2017-11-10 MED ORDER — TRAMADOL HCL 50 MG PO TABS: 50.0000 mg | ORAL_TABLET | Freq: Four times a day (QID) | ORAL | 0 refills | Status: DC | PRN

## 2017-11-10 MED ORDER — LIDOCAINE 2% (20 MG/ML) 5 ML SYRINGE
INTRAMUSCULAR | Status: AC
  Filled 2017-11-10: qty 5

## 2017-11-10 MED ORDER — MEPERIDINE HCL 50 MG/ML IJ SOLN: 6.2500 mg | INTRAMUSCULAR | Status: DC | PRN

## 2017-11-10 MED ORDER — BUPIVACAINE HCL (PF) 0.25 % IJ SOLN
INTRAMUSCULAR | Status: AC
  Filled 2017-11-10: qty 30

## 2017-11-10 MED ORDER — EPHEDRINE SULFATE-NACL 50-0.9 MG/10ML-% IV SOSY
PREFILLED_SYRINGE | INTRAVENOUS | Status: DC | PRN
  Administered 2017-11-10 (×3): 5 mg via INTRAVENOUS

## 2017-11-10 MED ORDER — ROCURONIUM BROMIDE 50 MG/5ML IV SOSY
PREFILLED_SYRINGE | INTRAVENOUS | Status: DC | PRN
  Administered 2017-11-10: 50 mg via INTRAVENOUS

## 2017-11-10 MED ORDER — FENTANYL CITRATE (PF) 250 MCG/5ML IJ SOLN
INTRAMUSCULAR | Status: AC
  Filled 2017-11-10: qty 5

## 2017-11-10 MED ORDER — ONDANSETRON HCL 4 MG/2ML IJ SOLN
INTRAMUSCULAR | Status: AC
  Filled 2017-11-10: qty 2

## 2017-11-10 MED ORDER — DEXAMETHASONE SODIUM PHOSPHATE 10 MG/ML IJ SOLN
INTRAMUSCULAR | Status: AC
  Filled 2017-11-10: qty 1

## 2017-11-10 SURGICAL SUPPLY — 42 items
ADH SKN CLS APL DERMABOND .7 (GAUZE/BANDAGES/DRESSINGS) ×1
ATTRACTOMAT 16X20 MAGNETIC DRP (DRAPES) ×2 IMPLANT
BLADE SURG 15 STRL LF DISP TIS (BLADE) ×1 IMPLANT
BLADE SURG 15 STRL SS (BLADE) ×2
CHLORAPREP W/TINT 26ML (MISCELLANEOUS) ×2 IMPLANT
CLIP VESOCCLUDE MED 6/CT (CLIP) ×4 IMPLANT
CLIP VESOCCLUDE SM WIDE 6/CT (CLIP) ×4 IMPLANT
COVER SURGICAL LIGHT HANDLE (MISCELLANEOUS) ×2 IMPLANT
COVER WAND RF STERILE (DRAPES) ×1 IMPLANT
DERMABOND ADVANCED (GAUZE/BANDAGES/DRESSINGS) ×1
DERMABOND ADVANCED .7 DNX12 (GAUZE/BANDAGES/DRESSINGS) IMPLANT
DRAPE LAPAROTOMY T 98X78 PEDS (DRAPES) ×2 IMPLANT
ELECT PENCIL ROCKER SW 15FT (MISCELLANEOUS) ×2 IMPLANT
ELECT REM PT RETURN 15FT ADLT (MISCELLANEOUS) ×2 IMPLANT
GAUZE 4X4 16PLY RFD (DISPOSABLE) ×2 IMPLANT
GLOVE BIOGEL PI IND STRL 7.0 (GLOVE) IMPLANT
GLOVE BIOGEL PI IND STRL 7.5 (GLOVE) IMPLANT
GLOVE BIOGEL PI INDICATOR 7.0 (GLOVE) ×1
GLOVE BIOGEL PI INDICATOR 7.5 (GLOVE) ×3
GLOVE SURG ORTHO 8.0 STRL STRW (GLOVE) ×2 IMPLANT
GLOVE SURG SS PI 7.0 STRL IVOR (GLOVE) ×1 IMPLANT
GOWN SPEC L4 XLG W/TWL (GOWN DISPOSABLE) ×1 IMPLANT
GOWN STRL REUS W/TWL LRG LVL3 (GOWN DISPOSABLE) ×1 IMPLANT
GOWN STRL REUS W/TWL XL LVL3 (GOWN DISPOSABLE) ×6 IMPLANT
HEMOSTAT SURGICEL 2X4 FIBR (HEMOSTASIS) ×1 IMPLANT
ILLUMINATOR WAVEGUIDE N/F (MISCELLANEOUS) IMPLANT
KIT BASIN OR (CUSTOM PROCEDURE TRAY) ×2 IMPLANT
LIGHT WAVEGUIDE WIDE FLAT (MISCELLANEOUS) IMPLANT
NDL HYPO 25X1 1.5 SAFETY (NEEDLE) ×1 IMPLANT
NEEDLE HYPO 25X1 1.5 SAFETY (NEEDLE) ×2 IMPLANT
PACK BASIC VI WITH GOWN DISP (CUSTOM PROCEDURE TRAY) ×2 IMPLANT
POWDER SURGICEL 3.0 GRAM (HEMOSTASIS) IMPLANT
STRIP CLOSURE SKIN 1/2X4 (GAUZE/BANDAGES/DRESSINGS) IMPLANT
SUT MNCRL AB 4-0 PS2 18 (SUTURE) ×2 IMPLANT
SUT SILK 2 0 (SUTURE)
SUT SILK 2-0 18XBRD TIE 12 (SUTURE) IMPLANT
SUT VIC AB 3-0 SH 18 (SUTURE) ×2 IMPLANT
SYR BULB IRRIGATION 50ML (SYRINGE) ×2 IMPLANT
SYR CONTROL 10ML LL (SYRINGE) ×2 IMPLANT
TOWEL OR 17X26 10 PK STRL BLUE (TOWEL DISPOSABLE) ×2 IMPLANT
TOWEL OR NON WOVEN STRL DISP B (DISPOSABLE) ×2 IMPLANT
YANKAUER SUCT BULB TIP 10FT TU (MISCELLANEOUS) ×2 IMPLANT

## 2017-11-10 NOTE — Discharge Instructions (Signed)
General Anesthesia, Adult, Care After These instructions provide you with information about caring for yourself after your procedure. Your health care provider may also give you more specific instructions. Your treatment has been planned according to current medical practices, but problems sometimes occur. Call your health care provider if you have any problems or questions after your procedure. What can I expect after the procedure? After the procedure, it is common to have:  Vomiting.  A sore throat.  Mental slowness.  It is common to feel:  Nauseous.  Cold or shivery.  Sleepy.  Tired.  Sore or achy, even in parts of your body where you did not have surgery.  Follow these instructions at home: For at least 24 hours after the procedure:  Do not: ? Participate in activities where you could fall or become injured. ? Drive. ? Use heavy machinery. ? Drink alcohol. ? Take sleeping pills or medicines that cause drowsiness. ? Make important decisions or sign legal documents. ? Take care of children on your own.  Rest. Eating and drinking  If you vomit, drink water, juice, or soup when you can drink without vomiting.  Drink enough fluid to keep your urine clear or pale yellow.  Make sure you have little or no nausea before eating solid foods.  Follow the diet recommended by your health care provider. General instructions  Have a responsible adult stay with you until you are awake and alert.  Return to your normal activities as told by your health care provider. Ask your health care provider what activities are safe for you.  Take over-the-counter and prescription medicines only as told by your health care provider.  If you smoke, do not smoke without supervision.  Keep all follow-up visits as told by your health care provider. This is important. Contact a health care provider if:  You continue to have nausea or vomiting at home, and medicines are not helpful.  You  cannot drink fluids or start eating again.  You cannot urinate after 8-12 hours.  You develop a skin rash.  You have fever.  You have increasing redness at the site of your procedure. Get help right away if:  You have difficulty breathing.  You have chest pain.  You have unexpected bleeding.  You feel that you are having a life-threatening or urgent problem. This information is not intended to replace advice given to you by your health care provider. Make sure you discuss any questions you have with your health care provider. Document Released: 04/12/2000 Document Revised: 06/09/2015 Document Reviewed: 12/19/2014 Elsevier Interactive Patient Education  2018 [place].     Parathyroidectomy, Care After Refer to this sheet in the next few weeks. These instructions provide you with information on caring for yourself after your procedure. Your health care provider may also give you more specific instructions. Your treatment has been planned according to current medical practices, but problems sometimes occur. Call your health care provider if you have any problems or questions after your procedure. What can I expect after the procedure? After your procedure, it is typical to have the following:  Tingling or numbness around your mouth or in your fingers or toes.  Temporary hoarseness.  Follow these instructions at home:  Take medicines only as directed by your health care provider.  There are many different ways to close and cover an incision, including stitches, skin glue, and adhesive strips. Follow your health care provider's instructions on: ? Incision care. ? Bandage (dressing) changes and removal. ?  Incision closure removal.  Do not take baths, swim, or use a hot tub until your health care provider approves.  Follow your health care providers instructions for eating and drinking. You may need to consume only liquids and soft foods for a day after the  procedure.  Rest for the first few days as you heal from the surgery. It may take that long before you can resume all of your usual activities.  Keep all follow-up visits as directed by your health care provider. This is important. Your health care provider needs to monitor the calcium level in your blood to make sure that it does not become low. Contact a health care provider if:  You have tingling or numbness in your lips, toes, or fingers that does not go away within a few days.  You have drainage, redness, swelling, or pain at your incision site.  You have trouble talking.  You have nausea or vomiting for more than 2 days.  You have a fever. Get help right away if: You have trouble breathing.  This information is not intended to replace advice given to you by your health care provider. Make sure you discuss any questions you have with your health care provider. Document Released: 08/01/2013 Document Revised: 09/07/2015 Document Reviewed: 05/30/2013 Elsevier Interactive Patient Education  [employer].

## 2017-11-10 NOTE — Anesthesia Procedure Notes (Signed)
Procedure Name: Intubation Date/Time: 11/10/2017 8:42 AM Performed by: Maxwell Caul, CRNA Pre-anesthesia Checklist: Patient identified, Emergency Drugs available, Suction available and Patient being monitored Patient Re-evaluated:Patient Re-evaluated prior to induction Oxygen Delivery Method: Circle system utilized Preoxygenation: Pre-oxygenation with 100% oxygen Induction Type: IV induction Ventilation: Mask ventilation without difficulty Laryngoscope Size: Mac and 4 Grade View: Grade I Tube type: Oral Tube size: 7.5 mm Number of attempts: 1 Airway Equipment and Method: Stylet Placement Confirmation: ETT inserted through vocal cords under direct vision,  positive ETCO2 and breath sounds checked- equal and bilateral Secured at: 22 cm Tube secured with: Tape Dental Injury: Teeth and Oropharynx as per pre-operative assessment

## 2017-11-10 NOTE — Transfer of Care (Signed)
Immediate Anesthesia Transfer of Care Note  Patient: Kenneth Chavez  Procedure(s) Performed: RIGHT INFERIOR PARATHYROIDECTOMY (Right )  Patient Location: PACU  Anesthesia Type:General  Level of Consciousness: awake, alert  and oriented  Airway & Oxygen Therapy: Patient Spontanous Breathing and Patient connected to face mask oxygen  Post-op Assessment: Report given to RN and Post -op Vital signs reviewed and stable  Post vital signs: Reviewed and stable  Last Vitals:  Vitals Value Taken Time  BP 150/73 11/10/2017  9:45 AM  Temp    Pulse 87 11/10/2017  9:47 AM  Resp 22 11/10/2017  9:47 AM  SpO2 100 % 11/10/2017  9:47 AM  Vitals shown include unvalidated device data.  Last Pain:  Vitals:   11/10/17 0739  TempSrc: Oral  PainSc:       Patients Stated Pain Goal: 4 (05/28/00 1117)  Complications: No apparent anesthesia complications

## 2017-11-10 NOTE — Op Note (Signed)
OPERATIVE REPORT - PARATHYROIDECTOMY  Preoperative diagnosis: Primary hyperparathyroidism  Postop diagnosis: Same  Procedure: Right inferior minimally invasive parathyroidectomy  Surgeon:  Armandina Gemma, MD  Anesthesia: General endotracheal  Estimated blood loss: Minimal  Preparation: ChloraPrep  Indications: Patient returns to the office to review his test results and to discuss surgery. Patient underwent thyroid ultrasound which demonstrated a 1 cm thyroid nodule in the left thyroid lobe. Biopsy was recommended. Biopsy was subsequently performed and showed a benign follicular nodule. 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 We plan right inferior minimally invasive parathyroidectomy.  Procedure: The patient was prepared in the pre-operative holding area. The patient was brought to the operating room and placed in a supine position on the operating room table. Following administration of general anesthesia, the patient was positioned and then prepped and draped in the usual strict aseptic fashion. After ascertaining that an adequate level of anesthesia been achieved, a neck incision was made with a #15 blade. Dissection was carried through subcutaneous tissues and platysma. Hemostasis was obtained with the electrocautery. Skin flaps were developed circumferentially and a Weitlander retractor was placed for exposure.  Strap muscles were incised in the midline. Strap muscles were reflected lateralley exposing the thyroid lobe. With gentle blunt dissection the thyroid lobe was mobilized.  Dissection was carried through adipose tissue and an enlarged parathyroid gland was identified. It was gently mobilized. Vascular structures were divided between small and medium ligaclips. Care was taken to avoid the recurrent laryngeal nerve and the esophagus. The parathyroid gland was completely excised. It was submitted to pathology where frozen  section confirmed parathyroid tissue consistent with adenoma.  Neck was irrigated with warm saline and good hemostasis was noted. Fibrillar was placed in the operative field. Strap muscles were reapproximated in the midline with interrupted 3-0 Vicryl sutures. Platysma was closed with interrupted 3-0 Vicryl sutures. Skin was closed with a running 4-0 Monocryl subcuticular suture. Marcaine was infiltrated circumferentially. Wound was washed and dried and Dermabond was applied. Patient was awakened from anesthesia and brought to the recovery room. The patient tolerated the procedure well.   Armandina Gemma, MD [facility] Office: [phone]

## 2017-11-10 NOTE — Anesthesia Postprocedure Evaluation (Signed)
Anesthesia Post Note  Patient: Kenneth Chavez  Procedure(s) Performed: RIGHT INFERIOR PARATHYROIDECTOMY (Right )     Patient location during evaluation: PACU Anesthesia Type: General Level of consciousness: awake and alert and oriented Pain management: pain level controlled Vital Signs Assessment: post-procedure vital signs reviewed and stable Respiratory status: spontaneous breathing, nonlabored ventilation and respiratory function stable Cardiovascular status: blood pressure returned to baseline and stable Postop Assessment: no apparent nausea or vomiting Anesthetic complications: no    Last Vitals:  Vitals:   11/10/17 1015 11/10/17 1020  BP: 138/71 140/89  Pulse: 76 75  Resp: 13 14  Temp: 36.4 C 36.4 C  SpO2: 97% 98%    Last Pain:  Vitals:   11/10/17 1020  TempSrc:   PainSc: 0-No pain                 [name]

## 2017-11-10 NOTE — Interval H&P Note (Signed)
History and Physical Interval Note:  11/10/2017 8:16 AM  Kenneth Chavez  has presented today for surgery, with the diagnosis of PRIMARY HYPERPARATHYROIDISM.  The various methods of treatment have been discussed with the patient and family. After consideration of risks, benefits and other options for treatment, the patient has consented to    Procedure(s): RIGHT INFERIOR PARATHYROIDECTOMY (Right) as a surgical intervention .    The patient's history has been reviewed, patient examined, no change in status, stable for surgery.  I have reviewed the patient's chart and labs.  Questions were answered to the patient's satisfaction.    Armandina Gemma, [facility] Office: [place]

## 2017-11-11 ENCOUNTER — Encounter (HOSPITAL_COMMUNITY): Payer: Self-pay | Admitting: Surgery

## 2017-11-11 DIAGNOSIS — Z1212 Encounter for screening for malignant neoplasm of rectum: Secondary | ICD-10-CM | POA: Diagnosis not present

## 2017-11-17 DIAGNOSIS — E1129 Type 2 diabetes mellitus with other diabetic kidney complication: Secondary | ICD-10-CM | POA: Diagnosis not present

## 2017-11-17 DIAGNOSIS — R972 Elevated prostate specific antigen [PSA]: Secondary | ICD-10-CM | POA: Diagnosis not present

## 2017-11-17 DIAGNOSIS — Z6825 Body mass index (BMI) 25.0-25.9, adult: Secondary | ICD-10-CM | POA: Diagnosis not present

## 2017-11-17 DIAGNOSIS — E210 Primary hyperparathyroidism: Secondary | ICD-10-CM | POA: Diagnosis not present

## 2017-11-17 DIAGNOSIS — Z0000 Encounter for general adult medical examination without abnormal findings: Secondary | ICD-10-CM | POA: Diagnosis not present

## 2017-11-17 DIAGNOSIS — I10 Essential (primary) hypertension: Secondary | ICD-10-CM | POA: Diagnosis not present

## 2017-11-17 DIAGNOSIS — E7849 Other hyperlipidemia: Secondary | ICD-10-CM | POA: Diagnosis not present

## 2017-11-23 DIAGNOSIS — D1801 Hemangioma of skin and subcutaneous tissue: Secondary | ICD-10-CM | POA: Diagnosis not present

## 2017-11-23 DIAGNOSIS — L570 Actinic keratosis: Secondary | ICD-10-CM | POA: Diagnosis not present

## 2017-11-23 DIAGNOSIS — D2361 Other benign neoplasm of skin of right upper limb, including shoulder: Secondary | ICD-10-CM | POA: Diagnosis not present

## 2017-11-23 DIAGNOSIS — D2272 Melanocytic nevi of left lower limb, including hip: Secondary | ICD-10-CM | POA: Diagnosis not present

## 2017-11-23 DIAGNOSIS — L821 Other seborrheic keratosis: Secondary | ICD-10-CM | POA: Diagnosis not present

## 2017-11-23 DIAGNOSIS — Z85828 Personal history of other malignant neoplasm of skin: Secondary | ICD-10-CM | POA: Diagnosis not present

## 2017-11-23 DIAGNOSIS — D2372 Other benign neoplasm of skin of left lower limb, including hip: Secondary | ICD-10-CM | POA: Diagnosis not present

## 2017-11-28 DIAGNOSIS — E210 Primary hyperparathyroidism: Secondary | ICD-10-CM | POA: Diagnosis not present

## 2017-12-06 MED FILL — PIOGLITAZONE HCL 30 MG TAB: 30 | 90 days supply | Qty: 90 | Fill #2

## 2017-12-06 MED FILL — EZETIMIBE-SIMVASTATIN 10-20: 10-20 | 90 days supply | Qty: 90 | Fill #2

## 2017-12-06 MED FILL — TELMISARTAN 80 MG TABS: 80 | 90 days supply | Qty: 90 | Fill #1

## 2017-12-06 MED FILL — VIT D2 1.25 MG (50,000 UNIT: 1.25 MG | 84 days supply | Qty: 12 | Fill #4

## 2017-12-13 DIAGNOSIS — R3915 Urgency of urination: Secondary | ICD-10-CM | POA: Diagnosis not present

## 2017-12-13 DIAGNOSIS — R972 Elevated prostate specific antigen [PSA]: Secondary | ICD-10-CM | POA: Diagnosis not present

## 2017-12-20 ENCOUNTER — Other Ambulatory Visit: Payer: Self-pay | Admitting: Urology

## 2017-12-30 ENCOUNTER — Other Ambulatory Visit: Payer: Self-pay

## 2017-12-30 ENCOUNTER — Encounter (HOSPITAL_BASED_OUTPATIENT_CLINIC_OR_DEPARTMENT_OTHER): Payer: Self-pay | Admitting: *Deleted

## 2017-12-30 NOTE — Progress Notes (Signed)
Spoke with [place] after midnight, arrive 630 am 01-04-18 [facility] Patient given overnight instructions by phone meds to take sip of water: vytorin, finasteride Needs I stat 8 Has surgery orders in epic

## 2018-01-04 ENCOUNTER — Encounter (HOSPITAL_COMMUNITY)
Admission: RE | Disposition: A | Discharge status: Home / Self Care | Payer: Self-pay | Source: Ambulatory Visit | Attending: Urology

## 2018-01-04 ENCOUNTER — Encounter (HOSPITAL_BASED_OUTPATIENT_CLINIC_OR_DEPARTMENT_OTHER): Payer: Self-pay

## 2018-01-04 ENCOUNTER — Observation Stay (HOSPITAL_BASED_OUTPATIENT_CLINIC_OR_DEPARTMENT_OTHER)
Admission: RE | Admit: 2018-01-04 | Discharge: 2018-01-05 | Disposition: A | Discharge status: Home / Self Care | Payer: 59 | Source: Ambulatory Visit | Attending: Urology | Admitting: Urology

## 2018-01-04 ENCOUNTER — Ambulatory Visit (HOSPITAL_BASED_OUTPATIENT_CLINIC_OR_DEPARTMENT_OTHER): Payer: 59 | Admitting: Anesthesiology

## 2018-01-04 DIAGNOSIS — N400 Enlarged prostate without lower urinary tract symptoms: Secondary | ICD-10-CM | POA: Diagnosis not present

## 2018-01-04 DIAGNOSIS — N401 Enlarged prostate with lower urinary tract symptoms: Principal | ICD-10-CM | POA: Insufficient documentation

## 2018-01-04 DIAGNOSIS — E892 Postprocedural hypoparathyroidism: Secondary | ICD-10-CM | POA: Insufficient documentation

## 2018-01-04 DIAGNOSIS — N3289 Other specified disorders of bladder: Secondary | ICD-10-CM | POA: Insufficient documentation

## 2018-01-04 DIAGNOSIS — N138 Other obstructive and reflux uropathy: Secondary | ICD-10-CM | POA: Insufficient documentation

## 2018-01-04 DIAGNOSIS — I10 Essential (primary) hypertension: Secondary | ICD-10-CM | POA: Diagnosis not present

## 2018-01-04 DIAGNOSIS — R3911 Hesitancy of micturition: Secondary | ICD-10-CM | POA: Diagnosis not present

## 2018-01-04 DIAGNOSIS — E119 Type 2 diabetes mellitus without complications: Secondary | ICD-10-CM | POA: Diagnosis not present

## 2018-01-04 DIAGNOSIS — E785 Hyperlipidemia, unspecified: Secondary | ICD-10-CM | POA: Insufficient documentation

## 2018-01-04 DIAGNOSIS — R3912 Poor urinary stream: Secondary | ICD-10-CM | POA: Diagnosis not present

## 2018-01-04 HISTORY — PX: TRANSURETHRAL RESECTION OF PROSTATE: SHX73

## 2018-01-04 LAB — POCT I-STAT, CHEM 8
BUN: 33 mg/dL — ABNORMAL HIGH (ref 8–23)
Calcium, Ion: 1.28 mmol/L (ref 1.15–1.40)
Chloride: 104 mmol/L (ref 98–111)
Creatinine, Ser: 1 mg/dL (ref 0.61–1.24)
Glucose, Bld: 118 mg/dL — ABNORMAL HIGH (ref 70–99)
HCT: 43 % (ref 39.0–52.0)
Hemoglobin: 14.6 g/dL (ref 13.0–17.0)
POTASSIUM: 3.8 mmol/L (ref 3.5–5.1)
Sodium: 140 mmol/L (ref 135–145)
TCO2: 29 mmol/L (ref 22–32)

## 2018-01-04 LAB — GLUCOSE, CAPILLARY
Glucose-Capillary: 106 mg/dL — ABNORMAL HIGH (ref 70–99)
Glucose-Capillary: 123 mg/dL — ABNORMAL HIGH (ref 70–99)
Glucose-Capillary: 178 mg/dL — ABNORMAL HIGH (ref 70–99)

## 2018-01-04 SURGERY — TURP (TRANSURETHRAL RESECTION OF PROSTATE)
Anesthesia: General | Site: Urethra

## 2018-01-04 MED ORDER — INSULIN ASPART 100 UNIT/ML ~~LOC~~ SOLN
0.0000 [IU] | Freq: Three times a day (TID) | SUBCUTANEOUS | Status: DC
  Filled 2018-01-04: qty 0.15

## 2018-01-04 MED ORDER — OXYCODONE HCL 5 MG/5ML PO SOLN
5.0000 mg | Freq: Once | ORAL | Status: DC | PRN
  Filled 2018-01-04: qty 5

## 2018-01-04 MED ORDER — LIDOCAINE 2% (20 MG/ML) 5 ML SYRINGE
INTRAMUSCULAR | Status: AC
  Filled 2018-01-04: qty 5

## 2018-01-04 MED ORDER — SODIUM CHLORIDE 0.9 % IR SOLN
Status: AC | PRN
  Administered 2018-01-04: 21000 mL

## 2018-01-04 MED ORDER — SENNOSIDES-DOCUSATE SODIUM 8.6-50 MG PO TABS
1.0000 | ORAL_TABLET | Freq: Two times a day (BID) | ORAL | Status: DC
  Administered 2018-01-04 – 2018-01-05 (×3): 1 via ORAL
  Filled 2018-01-04 (×3): qty 1

## 2018-01-04 MED ORDER — FENTANYL CITRATE (PF) 100 MCG/2ML IJ SOLN
25.0000 ug | INTRAMUSCULAR | Status: DC | PRN
  Administered 2018-01-04: 50 ug via INTRAVENOUS
  Filled 2018-01-04: qty 1

## 2018-01-04 MED ORDER — INSULIN ASPART 100 UNIT/ML ~~LOC~~ SOLN: 0.0000 [IU] | Freq: Three times a day (TID) | SUBCUTANEOUS | Status: DC

## 2018-01-04 MED ORDER — OXYCODONE HCL 5 MG PO TABS: 5.0000 mg | ORAL_TABLET | ORAL | Status: DC | PRN

## 2018-01-04 MED ORDER — LACTATED RINGERS IV SOLN
INTRAVENOUS | Status: DC
  Administered 2018-01-04: 07:00:00 via INTRAVENOUS
  Filled 2018-01-04: qty 1000

## 2018-01-04 MED ORDER — GENTAMICIN SULFATE 40 MG/ML IJ SOLN
5.0000 mg/kg | INTRAVENOUS | Status: AC
  Administered 2018-01-04: 400 mg via INTRAVENOUS
  Filled 2018-01-04: qty 10

## 2018-01-04 MED ORDER — ONDANSETRON HCL 4 MG/2ML IJ SOLN
INTRAMUSCULAR | Status: AC
  Filled 2018-01-04: qty 2

## 2018-01-04 MED ORDER — HYDROMORPHONE HCL 1 MG/ML IJ SOLN: 0.5000 mg | INTRAMUSCULAR | Status: DC | PRN

## 2018-01-04 MED ORDER — ARTIFICIAL TEARS OPHTHALMIC OINT
TOPICAL_OINTMENT | OPHTHALMIC | Status: AC
  Filled 2018-01-04: qty 3.5

## 2018-01-04 MED ORDER — FENTANYL CITRATE (PF) 100 MCG/2ML IJ SOLN
INTRAMUSCULAR | Status: AC
  Filled 2018-01-04: qty 2

## 2018-01-04 MED ORDER — ONDANSETRON HCL 4 MG/2ML IJ SOLN
INTRAMUSCULAR | Status: DC | PRN
  Administered 2018-01-04: 4 mg via INTRAVENOUS

## 2018-01-04 MED ORDER — PROPOFOL 10 MG/ML IV BOLUS
INTRAVENOUS | Status: DC | PRN
  Administered 2018-01-04: 160 mg via INTRAVENOUS

## 2018-01-04 MED ORDER — SODIUM CHLORIDE 0.9 % IV SOLN
INTRAVENOUS | Status: DC
  Administered 2018-01-04: 11:00:00 via INTRAVENOUS

## 2018-01-04 MED ORDER — FENTANYL CITRATE (PF) 100 MCG/2ML IJ SOLN
INTRAMUSCULAR | Status: DC | PRN
  Administered 2018-01-04 (×2): 50 ug via INTRAVENOUS

## 2018-01-04 MED ORDER — IRBESARTAN 75 MG PO TABS
75.0000 mg | ORAL_TABLET | Freq: Every day | ORAL | Status: DC
  Administered 2018-01-04 – 2018-01-05 (×2): 75 mg via ORAL
  Filled 2018-01-04 (×2): qty 1

## 2018-01-04 MED ORDER — GENTAMICIN SULFATE 40 MG/ML IJ SOLN
5.0000 mg/kg | INTRAVENOUS | Status: DC
  Filled 2018-01-04: qty 9.75

## 2018-01-04 MED ORDER — KETOROLAC TROMETHAMINE 30 MG/ML IJ SOLN
INTRAMUSCULAR | Status: AC
  Filled 2018-01-04: qty 1

## 2018-01-04 MED ORDER — LIDOCAINE 2% (20 MG/ML) 5 ML SYRINGE
INTRAMUSCULAR | Status: DC | PRN
  Administered 2018-01-04: 60 mg via INTRAVENOUS

## 2018-01-04 MED ORDER — SODIUM CHLORIDE 0.9 % IR SOLN
3000.0000 mL | Status: DC
  Administered 2018-01-04 (×2): 3000 mL

## 2018-01-04 MED ORDER — DEXAMETHASONE SODIUM PHOSPHATE 10 MG/ML IJ SOLN
INTRAMUSCULAR | Status: DC | PRN
  Administered 2018-01-04: 5 mg via INTRAVENOUS

## 2018-01-04 MED ORDER — BELLADONNA ALKALOIDS-OPIUM 16.2-60 MG RE SUPP
1.0000 | Freq: Four times a day (QID) | RECTAL | Status: DC | PRN
  Administered 2018-01-04: 1 via RECTAL
  Filled 2018-01-04: qty 1

## 2018-01-04 MED ORDER — PROPOFOL 10 MG/ML IV BOLUS
INTRAVENOUS | Status: AC
  Filled 2018-01-04: qty 40

## 2018-01-04 MED ORDER — DEXAMETHASONE SODIUM PHOSPHATE 10 MG/ML IJ SOLN
INTRAMUSCULAR | Status: AC
  Filled 2018-01-04: qty 1

## 2018-01-04 MED ORDER — PIOGLITAZONE HCL 30 MG PO TABS
30.0000 mg | ORAL_TABLET | Freq: Every day | ORAL | Status: DC
  Administered 2018-01-04 – 2018-01-05 (×2): 30 mg via ORAL
  Filled 2018-01-04 (×2): qty 1

## 2018-01-04 MED ORDER — OXYCODONE HCL 5 MG PO TABS
5.0000 mg | ORAL_TABLET | Freq: Once | ORAL | Status: DC | PRN
  Filled 2018-01-04: qty 1

## 2018-01-04 MED ORDER — PROMETHAZINE HCL 25 MG/ML IJ SOLN
6.2500 mg | INTRAMUSCULAR | Status: DC | PRN
  Filled 2018-01-04: qty 1

## 2018-01-04 MED ORDER — ACETAMINOPHEN 500 MG PO TABS
1000.0000 mg | ORAL_TABLET | Freq: Three times a day (TID) | ORAL | Status: AC
  Administered 2018-01-04 – 2018-01-05 (×3): 1000 mg via ORAL
  Filled 2018-01-04 (×3): qty 2

## 2018-01-04 MED ORDER — FINASTERIDE 5 MG PO TABS
5.0000 mg | ORAL_TABLET | Freq: Every day | ORAL | Status: DC
  Administered 2018-01-04 – 2018-01-05 (×2): 5 mg via ORAL
  Filled 2018-01-04 (×2): qty 1

## 2018-01-04 MED ORDER — EZETIMIBE-SIMVASTATIN 10-20 MG PO TABS
1.0000 | ORAL_TABLET | Freq: Every day | ORAL | Status: DC
  Administered 2018-01-04 – 2018-01-05 (×2): 1 via ORAL
  Filled 2018-01-04 (×2): qty 1

## 2018-01-04 SURGICAL SUPPLY — 31 items
BAG DRAIN URO-CYSTO SKYTR STRL (DRAIN) ×2 IMPLANT
BAG DRN ANRFLXCHMBR STRAP LEK (BAG)
BAG DRN UROCATH (DRAIN) ×1
BAG URINE DRAINAGE (UROLOGICAL SUPPLIES) ×1 IMPLANT
BAG URINE LEG 19OZ MD ST LTX (BAG) IMPLANT
BAG URINE LEG 500ML (DRAIN) IMPLANT
CATH FOLEY 2WAY SLVR  5CC 22FR (CATHETERS)
CATH FOLEY 2WAY SLVR 30CC 20FR (CATHETERS) IMPLANT
CATH FOLEY 2WAY SLVR 5CC 22FR (CATHETERS) IMPLANT
CATH FOLEY 3WAY 30CC 22F (CATHETERS) ×1 IMPLANT
CLOTH BEACON ORANGE TIMEOUT ST (SAFETY) ×2 IMPLANT
ELECT BIVAP BIPO 22/24 DONUT (ELECTROSURGICAL)
ELECT REM PT RETURN 9FT ADLT (ELECTROSURGICAL) ×2
ELECTRD BIVAP BIPO 22/24 DONUT (ELECTROSURGICAL) IMPLANT
ELECTRODE REM PT RTRN 9FT ADLT (ELECTROSURGICAL) ×1 IMPLANT
EVACUATOR MICROVAS BLADDER (UROLOGICAL SUPPLIES) IMPLANT
GLOVE BIO SURGEON STRL SZ7.5 (GLOVE) ×3 IMPLANT
GLOVE BIOGEL PI IND STRL 7.5 (GLOVE) IMPLANT
GLOVE BIOGEL PI INDICATOR 7.5 (GLOVE) ×1
GOWN STRL REUS W/ TWL LRG LVL3 (GOWN DISPOSABLE) ×1 IMPLANT
GOWN STRL REUS W/TWL LRG LVL3 (GOWN DISPOSABLE) ×4
HOLDER FOLEY CATH W/STRAP (MISCELLANEOUS) ×2 IMPLANT
KIT TURNOVER CYSTO (KITS) ×2 IMPLANT
LOOP CUT BIPOLAR 24F LRG (ELECTROSURGICAL) ×1 IMPLANT
MANIFOLD NEPTUNE II (INSTRUMENTS) ×1 IMPLANT
PACK CYSTO (CUSTOM PROCEDURE TRAY) ×2 IMPLANT
SYR 30ML LL (SYRINGE) ×1 IMPLANT
SYRINGE IRR TOOMEY STRL 70CC (SYRINGE) ×1 IMPLANT
TUBE CONNECTING 12X1/4 (SUCTIONS) ×1 IMPLANT
TUBING UROLOGY SET (TUBING) ×1 IMPLANT
WATER STERILE IRR 500ML POUR (IV SOLUTION) ×1 IMPLANT

## 2018-01-04 NOTE — Discharge Instructions (Signed)
1 - You may have urinary urgency (bladder spasms) and bloody urine on / off with catheter in place. This is normal. ° °2 - Call MD or go to ER for fever >102, severe pain / nausea / vomiting not relieved by medications, or acute change in medical status ° °

## 2018-01-04 NOTE — Brief Op Note (Signed)
01/04/2018  9:23 AM  PATIENT:  Kenneth Chavez  72 y.o. male  PRE-OPERATIVE DIAGNOSIS:  LARGE BENIGN PROSTATIC HYPERPLASIA  POST-OPERATIVE DIAGNOSIS:  LARGE BENIGN PROSTATIC HYPERPLASIA  PROCEDURE:  Procedure(s) with comments: TRANSURETHRAL RESECTION OF THE PROSTATE (TURP) (N/A) - 75 MINS  SURGEON:  Surgeon(s) and Role:    Alexis Frock, MD - Primary  PHYSICIAN ASSISTANT:   ASSISTANTS: none   ANESTHESIA:   general  EBL:  125mL   BLOOD ADMINISTERED:none  DRAINS: 3 way foley to NS irrigation   LOCAL MEDICATIONS USED:  NONE  SPECIMEN:  Source of Specimen:  prostate chips for GROSS ONLY  DISPOSITION OF SPECIMEN:  PATHOLOGY  COUNTS:  YES  TOURNIQUET:  * No tourniquets in log *  DICTATION: .Other Dictation: Dictation Number [id number]  PLAN OF CARE: Admit for overnight observation  PATIENT DISPOSITION:  PACU - hemodynamically stable.   Delay start of Pharmacological VTE agent (>24hrs) due to surgical blood loss or risk of bleeding: yes

## 2018-01-04 NOTE — Anesthesia Procedure Notes (Signed)
Procedure Name: LMA Insertion Date/Time: 01/04/2018 8:30 AM Performed by: Wanita Chamberlain, CRNA Pre-anesthesia Checklist: Patient identified, Emergency Drugs available, Suction available, Timeout performed and Patient being monitored Patient Re-evaluated:Patient Re-evaluated prior to induction Oxygen Delivery Method: Circle system utilized Preoxygenation: Pre-oxygenation with 100% oxygen Induction Type: IV induction Ventilation: Mask ventilation without difficulty LMA: LMA inserted LMA Size: 4.0 Number of attempts: 1 Placement Confirmation: breath sounds checked- equal and bilateral,  CO2 detector and positive ETCO2 Tube secured with: Tape Dental Injury: Teeth and Oropharynx as per pre-operative assessment

## 2018-01-04 NOTE — H&P (Signed)
Kenneth Chavez is an 72 y.o. male.    Chief Complaint: Pre-op Transurethral Resection of Prostate  HPI:   1 - Lower Urinary Tract Symptoms / BX-Proven BPH - progressive bother from mostly obstructive symptoms with hesitancy and weak stream. TRUS 17mL 2019. PVR 2018 <46mL. He states his opthamalogist has cautioned him about alpha blockers but does not have known h/o iris problems. Now on finasteride but little improvement.   2 - Prostate Screening / Elevated PSA -No FHX prostate cancer. s/p NEGATIVE biopsy 2019 at age 32 for PSA 4.1 and some mild assyemtry. TRUS 79.5 mL, no median lobe ==> STOP PSA bsed screening.   PMH sig for TNA, HLD, DM2. NO ischemic CV disease / blood thinners. He is retired Financial planner. His PCP is Leanna Battles MD.   Today " Kenneth Chavez " is seen to proceed with TURP for his med-refractory lower urinary tract symptoms. No interval fevers. Most recent UA without infectious parameters.     Past Medical History:  Diagnosis Date  . Diabetes mellitus without complication ([place])    type 2  . Hypertension     Past Surgical History:  Procedure Laterality Date  . PARATHYROIDECTOMY Right 11/10/2017   Procedure: RIGHT INFERIOR PARATHYROIDECTOMY;  Surgeon: Armandina Gemma, MD;  Location: [facility];  Service: General;  Laterality: Right;    History reviewed. No pertinent family history. Social History:  reports that he has never smoked. He has never used smokeless tobacco. He reports current alcohol use. He reports that he does not use drugs.  Allergies: No Known Allergies  No medications prior to admission.    No results found for this or any previous visit (from the past 48 hour(s)). No results found.  Review of Systems  Constitutional: Negative.  Negative for chills and fever.  HENT: Negative.   Eyes: Negative.   Respiratory: Negative.   Cardiovascular: Negative.   Gastrointestinal: Negative.   Genitourinary: Positive for frequency and urgency. Negative for  hematuria.  Musculoskeletal: Negative.   Skin: Negative.   Neurological: Negative.   Endo/Heme/Allergies: Negative.   Psychiatric/Behavioral: Negative.     Height 5\' 8"  (1.727 m), weight 79.4 kg. Physical Exam  Constitutional: He appears well-developed.  HENT:  Head: Normocephalic.  Eyes: Pupils are equal, round, and reactive to light.  Neck: Normal range of motion.  Cardiovascular: Normal rate.  Respiratory: Effort normal.  GI: Soft.  Genitourinary:    Genitourinary Comments: No CVAT   Musculoskeletal: Normal range of motion.  Neurological: He is alert.  Skin: Skin is warm.  Psychiatric: He has a normal mood and affect.     Assessment/Plan  1 - Lower Urinary Tract Symptoms / BX-Proven BPH - Proceed as planned with TURP. Risks, benefits, expected peri-op course discussed previously and reiterated today.   Alexis Frock, MD 01/04/2018, 6:31 AM

## 2018-01-04 NOTE — Progress Notes (Signed)
Patient's CBG at 1142 was 123. Current orders have SSI novolog with additional orders for no meal coverage at this time. Paged Dr. Tresa Moore for clarification. New orders obtained to withhold meal and HS coverage unless CBG > 250. Orders enacted, will continue to monitor. Lyndel Pleasure RN

## 2018-01-04 NOTE — Anesthesia Postprocedure Evaluation (Signed)
Anesthesia Post Note  Patient: Kenneth Chavez  Procedure(s) Performed: TRANSURETHRAL RESECTION OF THE PROSTATE (TURP) (N/A Urethra)     Patient location during evaluation: PACU Anesthesia Type: General Level of consciousness: awake and alert Pain management: pain level controlled Vital Signs Assessment: post-procedure vital signs reviewed and stable Respiratory status: spontaneous breathing, nonlabored ventilation, respiratory function stable and patient connected to nasal cannula oxygen Cardiovascular status: blood pressure returned to baseline and stable Postop Assessment: no apparent nausea or vomiting Anesthetic complications: no    Last Vitals:  Vitals:   01/04/18 1000 01/04/18 1015  BP: 117/61 127/62  Pulse: (!) 58 61  Resp: 12 15  Temp:    SpO2: 100% 99%    Last Pain:  Vitals:   01/04/18 1015  TempSrc:   PainSc: 0-No pain                 Mychal Decarlo S

## 2018-01-04 NOTE — Progress Notes (Signed)
Pt assisted out in hallway; ambulated approx 350 feet.  Tolerated well.  No c/o pain.  Kerlix wrap around penis meatus saturated with blood once pt was back in bed.  Replaced kerlix;  will monitor pt.

## 2018-01-04 NOTE — Transfer of Care (Signed)
Immediate Anesthesia Transfer of Care Note  Patient: Kenneth Chavez  Procedure(s) Performed: TRANSURETHRAL RESECTION OF THE PROSTATE (TURP) (N/A Urethra)  Patient Location: PACU  Anesthesia Type:General  Level of Consciousness: awake, alert , oriented and patient cooperative  Airway & Oxygen Therapy: Patient Spontanous Breathing and Patient connected to nasal cannula oxygen  Post-op Assessment: Report given to RN and Post -op Vital signs reviewed and stable  Post vital signs: Reviewed and stable  Last Vitals:  Vitals Value Taken Time  BP 138/73 01/04/2018  9:32 AM  Temp 36.7 C 01/04/2018  9:32 AM  Pulse 68 01/04/2018  9:35 AM  Resp 13 01/04/2018  9:35 AM  SpO2 99 % 01/04/2018  9:35 AM  Vitals shown include unvalidated device data.  Last Pain:  Vitals:   01/04/18 0650  TempSrc: Oral  PainSc: 0-No pain      Patients Stated Pain Goal: 5 ([id number])  Complications: No apparent anesthesia complications

## 2018-01-04 NOTE — Op Note (Signed)
Kenneth Chavez, Kenneth Chavez MEDICAL RECORD [id number] ACCOUNT [id number] DATE OF BIRTH:April 14, 1945 FACILITY: [facility] LOCATION: [facility] PHYSICIAN:Kazuko Clemence, MD  OPERATIVE REPORT  DATE OF PROCEDURE:  01/04/2018  PREOPERATIVE DIAGNOSIS:  Refractory prostatic hypertrophy with lower urinary tract symptoms.  PROCEDURE:  Transurethral resection of the prostate.  ESTIMATED BLOOD LOSS:  100 mL.  COMPLICATIONS:  None.  SPECIMENS:  Prostate chips for gross only.  FINDINGS: 1.  Large trilobar prostatic hypertrophy, some asymmetry, right greater than left. 2.  Wide open channel from the area of the bladder neck to the verumontanum following transurethral resection. 3.  Mild bladder trabeculation.  DRAINS:  One 22-French 3-way Foley catheter to normal saline irrigation.  Efflux light pink, 20 mL sterile water in the balloon.  INDICATIONS:  The patient is a very pleasant 72 year old gentleman with history of prostatic hypertrophy.  This is biopsy proven benign.  He has had progression of lower urinary tract symptoms with weak stream.  Frequency and urgency have become quite  refractory.  He did not tolerate alpha blockers due to ocular contraindication and several month trial of finasteride also has failed to meet his goals.  Further options were discussed including medical therapy versus outlet procedure.  He has preserved  contractility.  Given known volume prostate around 80 grams, it was felt that transurethral resection of the prostate would be most definitive procedure and he wished to proceed.  Informed consent was then placed in medical record.  DESCRIPTION OF PROCEDURE:  Patient identified as Kenneth Chavez, transurethral resection of the prostate was confirmed.  Procedure timeout was performed.  Intravenous antibiotics administered.  General LMA anesthesia induced.  The patient was placed  into a low lithotomy position.  Sterile field was created by prepping and draping the patient's  penis, perineum and proximal thighs using iodine.  Cystourethroscopy was performed using a primary 26-French resectoscope sheath with visual obturator.   First, the anterior and posterior urethra revealed trilobar prostatic hypertrophy, mostly lateral lobe, right greater than left.  There is mild to moderate bladder trabeculation.  Ureteral orifices were singleton.  No papillary lesions noted.  Next,  using resectoscope loop, very careful prostatic resection was performed in a top down orientation.  First, in the 12 o'clock position followed by the right lobe of the prostate from the bladder neck to verumontanum, and down the left lobe of the prostate  from the bladder neck to verumontanum.  Exquisite care was taken to avoid undermining the bladder neck, which did not occur.  These prostate chips were irrigated and set aside for gross identification only.  The base of the resection area was fulgurated  using coagulation current.  Final inspection revealed complete resolution of all obstructing prostate tissue between the bladder neck and verumontanum.  There was excellent hemostasis.  This completed removal of all prostate chips.  No evidence of  bladder or prostate perforation was noted.  The resectoscope was exchanged for a 22-French 3-way Foley catheter, 20 mL in the balloon. Normal saline irrigation reflux was quite light pink and the procedure terminated.    The patient tolerated the procedure well.  No immediate complications.  The patient to Postanesthesia Care in stable condition.  AN/NUANCE  D:01/04/2018 T:01/04/2018 JOB:[id number]

## 2018-01-04 NOTE — Anesthesia Preprocedure Evaluation (Signed)
Anesthesia Evaluation  Patient identified by MRN, date of birth, ID band Patient awake    Reviewed: Allergy & Precautions, NPO status , Patient's Chart, lab work & pertinent test results  Airway Mallampati: II  TM Distance: >3 FB Neck ROM: Full    Dental no notable dental hx.    Pulmonary neg pulmonary ROS,    Pulmonary exam normal breath sounds clear to auscultation       Cardiovascular hypertension, Normal cardiovascular exam Rhythm:Regular Rate:Normal     Neuro/Psych negative neurological ROS  negative psych ROS   GI/Hepatic negative GI ROS, Neg liver ROS,   Endo/Other  diabetes  Renal/GU negative Renal ROS  negative genitourinary   Musculoskeletal negative musculoskeletal ROS (+)   Abdominal   Peds negative pediatric ROS (+)  Hematology negative hematology ROS (+)   Anesthesia Other Findings   Reproductive/Obstetrics negative OB ROS                             Anesthesia Physical Anesthesia Plan  ASA: II  Anesthesia Plan: General   Post-op Pain Management:    Induction: Intravenous  PONV Risk Score and Plan: 2 and Ondansetron, Dexamethasone and Treatment may vary due to age or medical condition  Airway Management Planned: LMA  Additional Equipment:   Intra-op Plan:   Post-operative Plan: Extubation in OR  Informed Consent: I have reviewed the patients History and Physical, chart, labs and discussed the procedure including the risks, benefits and alternatives for the proposed anesthesia with the patient or authorized representative who has indicated his/her understanding and acceptance.     Dental advisory given  Plan Discussed with: CRNA and Surgeon  Anesthesia Plan Comments:         Anesthesia Quick Evaluation  

## 2018-01-05 ENCOUNTER — Encounter (HOSPITAL_BASED_OUTPATIENT_CLINIC_OR_DEPARTMENT_OTHER): Payer: Self-pay | Admitting: Urology

## 2018-01-05 DIAGNOSIS — I10 Essential (primary) hypertension: Secondary | ICD-10-CM | POA: Diagnosis not present

## 2018-01-05 DIAGNOSIS — R3912 Poor urinary stream: Secondary | ICD-10-CM | POA: Diagnosis not present

## 2018-01-05 DIAGNOSIS — E119 Type 2 diabetes mellitus without complications: Secondary | ICD-10-CM | POA: Diagnosis not present

## 2018-01-05 DIAGNOSIS — N3289 Other specified disorders of bladder: Secondary | ICD-10-CM | POA: Diagnosis not present

## 2018-01-05 DIAGNOSIS — E785 Hyperlipidemia, unspecified: Secondary | ICD-10-CM | POA: Diagnosis not present

## 2018-01-05 DIAGNOSIS — N138 Other obstructive and reflux uropathy: Secondary | ICD-10-CM | POA: Diagnosis not present

## 2018-01-05 DIAGNOSIS — R3911 Hesitancy of micturition: Secondary | ICD-10-CM | POA: Diagnosis not present

## 2018-01-05 DIAGNOSIS — N401 Enlarged prostate with lower urinary tract symptoms: Secondary | ICD-10-CM | POA: Diagnosis not present

## 2018-01-05 DIAGNOSIS — E892 Postprocedural hypoparathyroidism: Secondary | ICD-10-CM | POA: Diagnosis not present

## 2018-01-05 LAB — GLUCOSE, CAPILLARY
GLUCOSE-CAPILLARY: 137 mg/dL — AB (ref 70–99)
GLUCOSE-CAPILLARY: 120 mg/dL — AB (ref 70–99)
Glucose-Capillary: 145 mg/dL — ABNORMAL HIGH (ref 70–99)

## 2018-01-05 LAB — HEMOGLOBIN AND HEMATOCRIT, BLOOD
HCT: 38.6 % — ABNORMAL LOW (ref 39.0–52.0)
Hemoglobin: 12 g/dL — ABNORMAL LOW (ref 13.0–17.0)

## 2018-01-05 MED ORDER — TRAMADOL HCL 50 MG PO TABS: 50.0000 mg | ORAL_TABLET | Freq: Four times a day (QID) | ORAL | 0 refills | Status: AC | PRN

## 2018-01-05 MED ORDER — SULFAMETHOXAZOLE-TRIMETHOPRIM 800-160 MG PO TABS: 1.0000 | ORAL_TABLET | Freq: Two times a day (BID) | ORAL | 0 refills | Status: DC

## 2018-01-05 MED FILL — SULFAMETHOXAZOLE-TMP DS TAB: 800-160 | 3 days supply | Qty: 6 | Fill #0

## 2018-01-05 NOTE — Discharge Summary (Signed)
Physician Discharge Summary  Patient ID: Kenneth Chavez MRN: [id number] DOB/AGE: [birth year] 72 y.o.  Admit date: 01/04/2018 Discharge date: 01/05/2018  Admission Diagnoses: Enlarged Prostate with Refractory Urinary Symptoms  Discharge Diagnoses:  Active Problems:   Prostatic hyperplasia   Discharged Condition: good  Hospital Course: Pt underwent transurethral resection of the prostte on 01/04/18, the day of admission, without acute complication. He was admitted for overnight observation post-op. By the afternoon of POD 1 he is ambulatory, maintining PO hydration, pain managaed, good UOP off bladder irrigation, and felt adequate for discharge. hgb 12.0. Path pending.   Consults: None  Significant Diagnostic Studies: labs: as per above  Treatments: surgery: as per above  Discharge Exam: Blood pressure 135/60, pulse 68, temperature 97.6 F (36.4 C), temperature source Oral, resp. rate 16, height 5\' 8"  (1.727 m), weight 79 kg, SpO2 98 %. General appearance: alert, cooperative and appears stated age Eyes: negative Nose: Nares normal. Septum midline. Mucosa normal. No drainage or sinus tenderness. Throat: lips, mucosa, and tongue normal; teeth and gums normal Neck: supple, symmetrical, trachea midline Back: symmetric, no curvature. ROM normal. No CVA tenderness. Resp: non-labored on room air Cardio: Nl rate GI: soft, non-tender; bowel sounds normal; no masses,  no organomegaly Male genitalia: normal, foley c/d/i with medium yellow urine off irrigation.  Extremities: extremities normal, atraumatic, no cyanosis or edema Lymph nodes: Cervical, supraclavicular, and axillary nodes normal. Neurologic: Grossly normal Incision/Wound:  Disposition: Discharge disposition: 01-Home or Self Care         Follow-up Information    Alexis Frock, MD On 01/09/2018.   Specialty:  Urology Why:  at 12:30 for MD visit and office catheter removal.  Contact information: [place]  [address] [phone]           Signed: Alexis Frock 01/05/2018, 1:01 PM

## 2018-01-16 DIAGNOSIS — H52203 Unspecified astigmatism, bilateral: Secondary | ICD-10-CM | POA: Diagnosis not present

## 2018-02-01 MED FILL — FINASTERIDE 5 MG TABLET: 5 | 90 days supply | Qty: 90 | Fill #0

## 2018-03-06 MED FILL — EZETIMIBE-SIMVASTATIN 10-20: 10-20 | 90 days supply | Qty: 90 | Fill #0

## 2018-03-06 MED FILL — VIT D2 1.25 MG (50,000 UNIT: 1.25 MG | 84 days supply | Qty: 12 | Fill #0

## 2018-03-06 MED FILL — TELMISARTAN 80 MG TABS: 80 | 90 days supply | Qty: 90 | Fill #0

## 2018-03-06 MED FILL — PIOGLITAZONE HCL 30 MG TAB: 30 | 90 days supply | Qty: 90 | Fill #0

## 2018-04-11 DIAGNOSIS — R3915 Urgency of urination: Secondary | ICD-10-CM | POA: Diagnosis not present

## 2018-04-11 DIAGNOSIS — R972 Elevated prostate specific antigen [PSA]: Secondary | ICD-10-CM | POA: Diagnosis not present

## 2018-04-25 DIAGNOSIS — I10 Essential (primary) hypertension: Secondary | ICD-10-CM | POA: Diagnosis not present

## 2018-04-25 DIAGNOSIS — E1129 Type 2 diabetes mellitus with other diabetic kidney complication: Secondary | ICD-10-CM | POA: Diagnosis not present

## 2018-04-25 DIAGNOSIS — Z1331 Encounter for screening for depression: Secondary | ICD-10-CM | POA: Diagnosis not present

## 2018-04-25 MED FILL — FINASTERIDE 5 MG TABLET: 5 | 90 days supply | Qty: 90 | Fill #0

## 2018-05-22 DIAGNOSIS — D2272 Melanocytic nevi of left lower limb, including hip: Secondary | ICD-10-CM | POA: Diagnosis not present

## 2018-05-22 DIAGNOSIS — L570 Actinic keratosis: Secondary | ICD-10-CM | POA: Diagnosis not present

## 2018-05-22 DIAGNOSIS — L820 Inflamed seborrheic keratosis: Secondary | ICD-10-CM | POA: Diagnosis not present

## 2018-05-22 DIAGNOSIS — D225 Melanocytic nevi of trunk: Secondary | ICD-10-CM | POA: Diagnosis not present

## 2018-05-22 DIAGNOSIS — Z85828 Personal history of other malignant neoplasm of skin: Secondary | ICD-10-CM | POA: Diagnosis not present

## 2018-05-22 DIAGNOSIS — D1801 Hemangioma of skin and subcutaneous tissue: Secondary | ICD-10-CM | POA: Diagnosis not present

## 2018-05-22 DIAGNOSIS — L821 Other seborrheic keratosis: Secondary | ICD-10-CM | POA: Diagnosis not present

## 2018-05-22 DIAGNOSIS — D485 Neoplasm of uncertain behavior of skin: Secondary | ICD-10-CM | POA: Diagnosis not present

## 2018-05-31 MED FILL — TELMISARTAN 80 MG TABS: 80 | 90 days supply | Qty: 90 | Fill #0

## 2018-05-31 MED FILL — EZETIMIBE-SIMVASTATIN 10-20: 10-20 | 90 days supply | Qty: 90 | Fill #0

## 2018-05-31 MED FILL — VIT D2 1.25 MG (50,000 UNIT: 1.25 MG | 84 days supply | Qty: 12 | Fill #0

## 2018-05-31 MED FILL — PIOGLITAZONE HCL 30 MG TABS: 30 | 90 days supply | Qty: 90 | Fill #0

## 2018-07-20 MED FILL — FINASTERIDE 5 MG TABLET: 5 | 90 days supply | Qty: 90 | Fill #1

## 2018-08-28 MED FILL — PIOGLITAZONE HCL 30 MG TABS: 30 | 90 days supply | Qty: 90 | Fill #0

## 2018-08-28 MED FILL — TELMISARTAN 80 MG TABLET: 80 | 90 days supply | Qty: 90 | Fill #0

## 2018-08-28 MED FILL — EZETIMIBE-SIMVASTATIN 10-20: 10-20 | 90 days supply | Qty: 90 | Fill #1

## 2018-08-29 MED FILL — VIT D2 1.25 MG (50,000 UNIT: 1.25 MG | 84 days supply | Qty: 12 | Fill #0

## 2018-09-12 ENCOUNTER — Other Ambulatory Visit: Payer: Self-pay | Admitting: Surgery

## 2018-09-12 DIAGNOSIS — E041 Nontoxic single thyroid nodule: Secondary | ICD-10-CM

## 2018-09-15 ENCOUNTER — Other Ambulatory Visit: Payer: 59

## 2018-09-19 ENCOUNTER — Other Ambulatory Visit: Payer: 59

## 2018-09-19 ENCOUNTER — Ambulatory Visit
Admission: RE | Admit: 2018-09-19 | Discharge: 2018-09-19 | Disposition: A | Discharge status: Home / Self Care | Payer: 59 | Source: Ambulatory Visit | Attending: Surgery | Admitting: Surgery

## 2018-09-19 DIAGNOSIS — E041 Nontoxic single thyroid nodule: Secondary | ICD-10-CM | POA: Diagnosis not present

## 2018-10-06 DIAGNOSIS — Z23 Encounter for immunization: Secondary | ICD-10-CM | POA: Diagnosis not present

## 2018-10-20 DIAGNOSIS — Z9009 Acquired absence of other part of head and neck: Secondary | ICD-10-CM | POA: Diagnosis not present

## 2018-10-20 DIAGNOSIS — E041 Nontoxic single thyroid nodule: Secondary | ICD-10-CM | POA: Diagnosis not present

## 2018-10-20 MED FILL — FINASTERIDE 5 MG TABLET: 5 | 90 days supply | Qty: 90 | Fill #2

## 2018-11-21 DIAGNOSIS — L570 Actinic keratosis: Secondary | ICD-10-CM | POA: Diagnosis not present

## 2018-11-21 DIAGNOSIS — E1129 Type 2 diabetes mellitus with other diabetic kidney complication: Secondary | ICD-10-CM | POA: Diagnosis not present

## 2018-11-21 DIAGNOSIS — D2361 Other benign neoplasm of skin of right upper limb, including shoulder: Secondary | ICD-10-CM | POA: Diagnosis not present

## 2018-11-21 DIAGNOSIS — D1801 Hemangioma of skin and subcutaneous tissue: Secondary | ICD-10-CM | POA: Diagnosis not present

## 2018-11-21 DIAGNOSIS — D225 Melanocytic nevi of trunk: Secondary | ICD-10-CM | POA: Diagnosis not present

## 2018-11-21 DIAGNOSIS — D485 Neoplasm of uncertain behavior of skin: Secondary | ICD-10-CM | POA: Diagnosis not present

## 2018-11-21 DIAGNOSIS — D0439 Carcinoma in situ of skin of other parts of face: Secondary | ICD-10-CM | POA: Diagnosis not present

## 2018-11-21 DIAGNOSIS — Z85828 Personal history of other malignant neoplasm of skin: Secondary | ICD-10-CM | POA: Diagnosis not present

## 2018-11-21 DIAGNOSIS — Z0000 Encounter for general adult medical examination without abnormal findings: Secondary | ICD-10-CM | POA: Diagnosis not present

## 2018-11-21 DIAGNOSIS — E7849 Other hyperlipidemia: Secondary | ICD-10-CM | POA: Diagnosis not present

## 2018-11-21 DIAGNOSIS — L821 Other seborrheic keratosis: Secondary | ICD-10-CM | POA: Diagnosis not present

## 2018-11-21 DIAGNOSIS — Z125 Encounter for screening for malignant neoplasm of prostate: Secondary | ICD-10-CM | POA: Diagnosis not present

## 2018-11-24 MED FILL — TELMISARTAN 80 MG TABLET: 80 | 90 days supply | Qty: 90 | Fill #1

## 2018-11-24 MED FILL — EZETIMIBE-SIMVASTATIN 10-20: 10-20 | 90 days supply | Qty: 90 | Fill #0

## 2018-11-24 MED FILL — VIT D2 1.25 MG (50,000 UNIT: 1.25 MG | 84 days supply | Qty: 12 | Fill #1

## 2018-11-24 MED FILL — PIOGLITAZONE HCL 30 MG TABS: 30 | 90 days supply | Qty: 90 | Fill #1

## 2018-12-04 DIAGNOSIS — Z1339 Encounter for screening examination for other mental health and behavioral disorders: Secondary | ICD-10-CM | POA: Diagnosis not present

## 2018-12-04 DIAGNOSIS — R3911 Hesitancy of micturition: Secondary | ICD-10-CM | POA: Diagnosis not present

## 2018-12-04 DIAGNOSIS — E210 Primary hyperparathyroidism: Secondary | ICD-10-CM | POA: Diagnosis not present

## 2018-12-04 DIAGNOSIS — Z0000 Encounter for general adult medical examination without abnormal findings: Secondary | ICD-10-CM | POA: Diagnosis not present

## 2018-12-04 DIAGNOSIS — E785 Hyperlipidemia, unspecified: Secondary | ICD-10-CM | POA: Diagnosis not present

## 2018-12-04 DIAGNOSIS — E1129 Type 2 diabetes mellitus with other diabetic kidney complication: Secondary | ICD-10-CM | POA: Diagnosis not present

## 2018-12-04 DIAGNOSIS — I10 Essential (primary) hypertension: Secondary | ICD-10-CM | POA: Diagnosis not present

## 2018-12-05 DIAGNOSIS — Z85828 Personal history of other malignant neoplasm of skin: Secondary | ICD-10-CM | POA: Diagnosis not present

## 2018-12-05 DIAGNOSIS — L821 Other seborrheic keratosis: Secondary | ICD-10-CM | POA: Diagnosis not present

## 2018-12-05 DIAGNOSIS — D0439 Carcinoma in situ of skin of other parts of face: Secondary | ICD-10-CM | POA: Diagnosis not present

## 2018-12-05 DIAGNOSIS — I10 Essential (primary) hypertension: Secondary | ICD-10-CM | POA: Diagnosis not present

## 2018-12-05 DIAGNOSIS — R82998 Other abnormal findings in urine: Secondary | ICD-10-CM | POA: Diagnosis not present

## 2018-12-05 MED FILL — IMIQUIMOD 5 % CREA: 5 | 24 days supply | Qty: 12 | Fill #0

## 2019-01-02 DIAGNOSIS — Z1212 Encounter for screening for malignant neoplasm of rectum: Secondary | ICD-10-CM | POA: Diagnosis not present

## 2019-01-17 MED FILL — FINASTERIDE 5 MG TABLET: 5 | 90 days supply | Qty: 90 | Fill #0

## 2019-01-22 DIAGNOSIS — H5213 Myopia, bilateral: Secondary | ICD-10-CM | POA: Diagnosis not present

## 2019-02-16 MED FILL — EZETIMIBE-SIMVASTATIN 10-20: 10-20 | 90 days supply | Qty: 90 | Fill #1

## 2019-02-16 MED FILL — PIOGLITAZONE HCL 30 MG TAB: 30 | 90 days supply | Qty: 90 | Fill #0

## 2019-02-16 MED FILL — TELMISARTAN 80 MG TABLET: 80 | 90 days supply | Qty: 90 | Fill #0

## 2019-02-16 MED FILL — VIT D2 1.25 MG (50,000 UNIT: 1.25 MG | 84 days supply | Qty: 12 | Fill #0

## 2019-04-08 MED FILL — FINASTERIDE 5 MG TABLET: 5 | 90 days supply | Qty: 90 | Fill #1

## 2019-04-16 DIAGNOSIS — R3915 Urgency of urination: Secondary | ICD-10-CM | POA: Diagnosis not present

## 2019-05-16 MED FILL — EZETIMIBE-SIMVASTATIN 10-20: 10-20 | 90 days supply | Qty: 90 | Fill #0

## 2019-05-16 MED FILL — TELMISARTAN 80 MG TABS: 80 | 90 days supply | Qty: 90 | Fill #1

## 2019-05-16 MED FILL — PIOGLITAZONE HCL 30 MG TAB: 30 | 90 days supply | Qty: 90 | Fill #1

## 2019-05-16 MED FILL — VIT D2 1.25 MG (50,000 UNIT: 1.25 MG | 84 days supply | Qty: 12 | Fill #1

## 2019-05-21 DIAGNOSIS — L821 Other seborrheic keratosis: Secondary | ICD-10-CM | POA: Diagnosis not present

## 2019-05-21 DIAGNOSIS — D225 Melanocytic nevi of trunk: Secondary | ICD-10-CM | POA: Diagnosis not present

## 2019-05-21 DIAGNOSIS — D2361 Other benign neoplasm of skin of right upper limb, including shoulder: Secondary | ICD-10-CM | POA: Diagnosis not present

## 2019-05-21 DIAGNOSIS — L570 Actinic keratosis: Secondary | ICD-10-CM | POA: Diagnosis not present

## 2019-05-21 DIAGNOSIS — D2272 Melanocytic nevi of left lower limb, including hip: Secondary | ICD-10-CM | POA: Diagnosis not present

## 2019-05-21 DIAGNOSIS — Z85828 Personal history of other malignant neoplasm of skin: Secondary | ICD-10-CM | POA: Diagnosis not present

## 2019-06-04 DIAGNOSIS — I10 Essential (primary) hypertension: Secondary | ICD-10-CM | POA: Diagnosis not present

## 2019-06-04 DIAGNOSIS — E1129 Type 2 diabetes mellitus with other diabetic kidney complication: Secondary | ICD-10-CM | POA: Diagnosis not present

## 2019-06-04 DIAGNOSIS — E7849 Other hyperlipidemia: Secondary | ICD-10-CM | POA: Diagnosis not present

## 2019-07-12 MED FILL — FINASTERIDE 5 MG TABLET: 5 | 90 days supply | Qty: 90 | Fill #0

## 2019-08-13 MED FILL — EZETIMIBE-SIMVASTATIN 10-20: 10-20 | 90 days supply | Qty: 90 | Fill #1

## 2019-08-13 MED FILL — PIOGLITAZONE HCL 30 MG TAB: 30 | 90 days supply | Qty: 90 | Fill #2

## 2019-08-13 MED FILL — VIT D2 1.25 MG (50,000 UNIT: 1.25 MG | 84 days supply | Qty: 12 | Fill #2

## 2019-08-13 MED FILL — TELMISARTAN 80 MG TABS: 80 | 90 days supply | Qty: 90 | Fill #2

## 2019-09-21 ENCOUNTER — Other Ambulatory Visit: Payer: Self-pay | Admitting: Surgery

## 2019-09-21 DIAGNOSIS — E041 Nontoxic single thyroid nodule: Secondary | ICD-10-CM

## 2019-09-25 DIAGNOSIS — E041 Nontoxic single thyroid nodule: Secondary | ICD-10-CM | POA: Diagnosis not present

## 2019-09-26 ENCOUNTER — Ambulatory Visit
Admission: RE | Admit: 2019-09-26 | Discharge: 2019-09-26 | Disposition: A | Discharge status: Home / Self Care | Payer: 59 | Source: Ambulatory Visit | Attending: Surgery | Admitting: Surgery

## 2019-09-26 DIAGNOSIS — E041 Nontoxic single thyroid nodule: Secondary | ICD-10-CM

## 2019-09-26 DIAGNOSIS — E042 Nontoxic multinodular goiter: Secondary | ICD-10-CM | POA: Diagnosis not present

## 2019-10-02 ENCOUNTER — Other Ambulatory Visit: Payer: 59

## 2019-10-10 MED FILL — FINASTERIDE 5 MG TABLET: 5 | 90 days supply | Qty: 90 | Fill #1

## 2019-10-30 DIAGNOSIS — E892 Postprocedural hypoparathyroidism: Secondary | ICD-10-CM | POA: Diagnosis not present

## 2019-10-30 DIAGNOSIS — E042 Nontoxic multinodular goiter: Secondary | ICD-10-CM | POA: Diagnosis not present

## 2019-11-06 ENCOUNTER — Other Ambulatory Visit (HOSPITAL_COMMUNITY): Payer: Self-pay | Admitting: Internal Medicine

## 2019-11-06 MED FILL — TELMISARTAN 80 MG TABS: 80 | 90 days supply | Qty: 90 | Fill #3

## 2019-11-06 MED FILL — EZETIMIBE-SIMVASTATIN 10-20: 10-20 | 90 days supply | Qty: 90 | Fill #0

## 2019-11-06 MED FILL — PIOGLITAZONE HCL 30 MG TABS: 30 | 90 days supply | Qty: 90 | Fill #3

## 2019-11-06 MED FILL — VIT D2 1.25 MG (50,000 UNIT: 1.25 MG | 84 days supply | Qty: 12 | Fill #3

## 2019-11-07 DIAGNOSIS — Z23 Encounter for immunization: Secondary | ICD-10-CM | POA: Diagnosis not present

## 2019-11-27 DIAGNOSIS — L565 Disseminated superficial actinic porokeratosis (DSAP): Secondary | ICD-10-CM | POA: Diagnosis not present

## 2019-11-27 DIAGNOSIS — D2361 Other benign neoplasm of skin of right upper limb, including shoulder: Secondary | ICD-10-CM | POA: Diagnosis not present

## 2019-11-27 DIAGNOSIS — Z85828 Personal history of other malignant neoplasm of skin: Secondary | ICD-10-CM | POA: Diagnosis not present

## 2019-11-27 DIAGNOSIS — D2372 Other benign neoplasm of skin of left lower limb, including hip: Secondary | ICD-10-CM | POA: Diagnosis not present

## 2019-11-27 DIAGNOSIS — L821 Other seborrheic keratosis: Secondary | ICD-10-CM | POA: Diagnosis not present

## 2019-11-27 DIAGNOSIS — D225 Melanocytic nevi of trunk: Secondary | ICD-10-CM | POA: Diagnosis not present

## 2019-11-27 DIAGNOSIS — D1801 Hemangioma of skin and subcutaneous tissue: Secondary | ICD-10-CM | POA: Diagnosis not present

## 2019-11-27 DIAGNOSIS — L814 Other melanin hyperpigmentation: Secondary | ICD-10-CM | POA: Diagnosis not present

## 2019-11-27 DIAGNOSIS — L570 Actinic keratosis: Secondary | ICD-10-CM | POA: Diagnosis not present

## 2019-11-30 DIAGNOSIS — N182 Chronic kidney disease, stage 2 (mild): Secondary | ICD-10-CM | POA: Diagnosis not present

## 2019-11-30 DIAGNOSIS — E785 Hyperlipidemia, unspecified: Secondary | ICD-10-CM | POA: Diagnosis not present

## 2019-11-30 DIAGNOSIS — I10 Essential (primary) hypertension: Secondary | ICD-10-CM | POA: Diagnosis not present

## 2019-11-30 DIAGNOSIS — R82998 Other abnormal findings in urine: Secondary | ICD-10-CM | POA: Diagnosis not present

## 2019-11-30 DIAGNOSIS — E1129 Type 2 diabetes mellitus with other diabetic kidney complication: Secondary | ICD-10-CM | POA: Diagnosis not present

## 2019-11-30 DIAGNOSIS — Z125 Encounter for screening for malignant neoplasm of prostate: Secondary | ICD-10-CM | POA: Diagnosis not present

## 2019-12-07 DIAGNOSIS — E041 Nontoxic single thyroid nodule: Secondary | ICD-10-CM | POA: Diagnosis not present

## 2019-12-07 DIAGNOSIS — E119 Type 2 diabetes mellitus without complications: Secondary | ICD-10-CM | POA: Diagnosis not present

## 2019-12-07 DIAGNOSIS — I10 Essential (primary) hypertension: Secondary | ICD-10-CM | POA: Diagnosis not present

## 2019-12-07 DIAGNOSIS — Z0000 Encounter for general adult medical examination without abnormal findings: Secondary | ICD-10-CM | POA: Diagnosis not present

## 2019-12-07 DIAGNOSIS — J45909 Unspecified asthma, uncomplicated: Secondary | ICD-10-CM | POA: Diagnosis not present

## 2019-12-07 DIAGNOSIS — E785 Hyperlipidemia, unspecified: Secondary | ICD-10-CM | POA: Diagnosis not present

## 2019-12-21 DIAGNOSIS — Z1212 Encounter for screening for malignant neoplasm of rectum: Secondary | ICD-10-CM | POA: Diagnosis not present

## 2020-01-14 MED FILL — FINASTERIDE 5 MG TABLET: 5 | 90 days supply | Qty: 90 | Fill #2

## 2020-01-24 DIAGNOSIS — H52203 Unspecified astigmatism, bilateral: Secondary | ICD-10-CM | POA: Diagnosis not present

## 2020-01-30 ENCOUNTER — Other Ambulatory Visit (HOSPITAL_COMMUNITY): Payer: Self-pay | Admitting: Internal Medicine

## 2020-01-30 MED FILL — TELMISARTAN 80 MG TABS: 80 | 90 days supply | Qty: 90 | Fill #0

## 2020-01-30 MED FILL — PIOGLITAZONE HCL 30 MG TABS: 30 | 90 days supply | Qty: 90 | Fill #0

## 2020-01-30 MED FILL — EZETIMIBE-SIMVASTATIN 10-20: 10-20 | 90 days supply | Qty: 90 | Fill #1

## 2020-01-30 MED FILL — VIT D2 1.25 MG (50,000 UNIT: 1.25 MG | 84 days supply | Qty: 12 | Fill #0

## 2020-04-04 IMAGING — US US THYROID
1 series · 13 of 25 positions shown · non-contrast
Comparison: Parathyroid imaging with SPECT

CLINICAL DATA: Primary hyperparathyroidism.

EXAM:
THYROID ULTRASOUND
TECHNIQUE: Ultrasound examination of the thyroid gland and adjacent soft
tissues was performed.

[Series 1: us thyroid · 0.07mm/px · 13 of 63 slices shown]
[im 1/63]
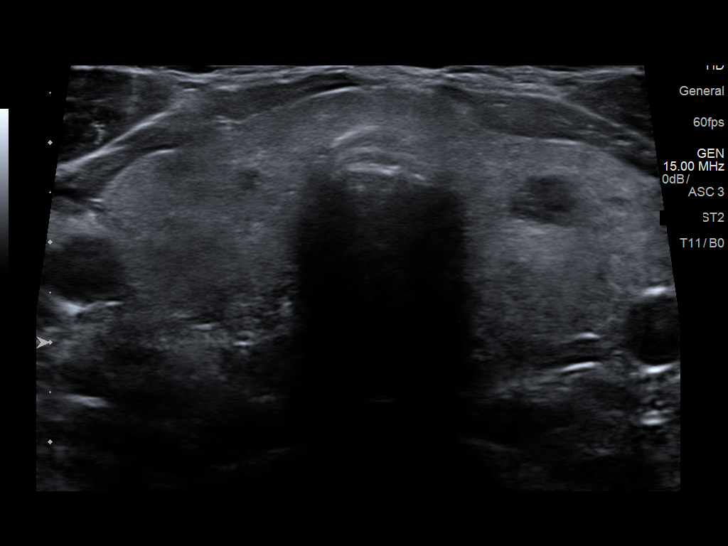
[im 6/63]
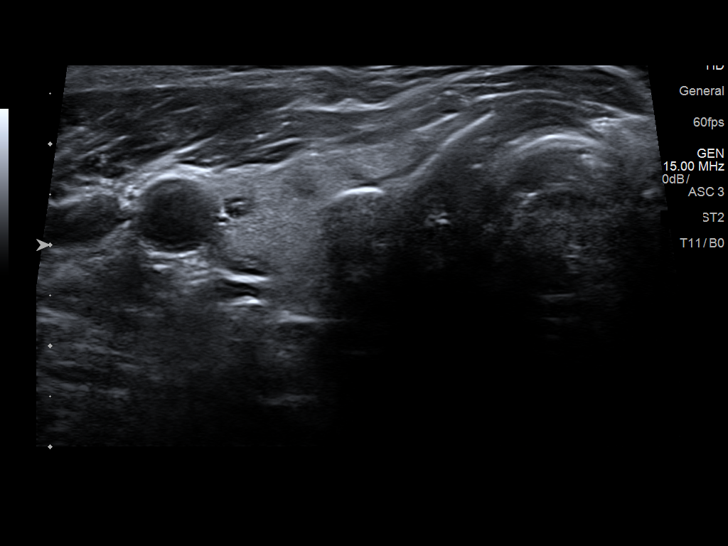
[im 11/63]
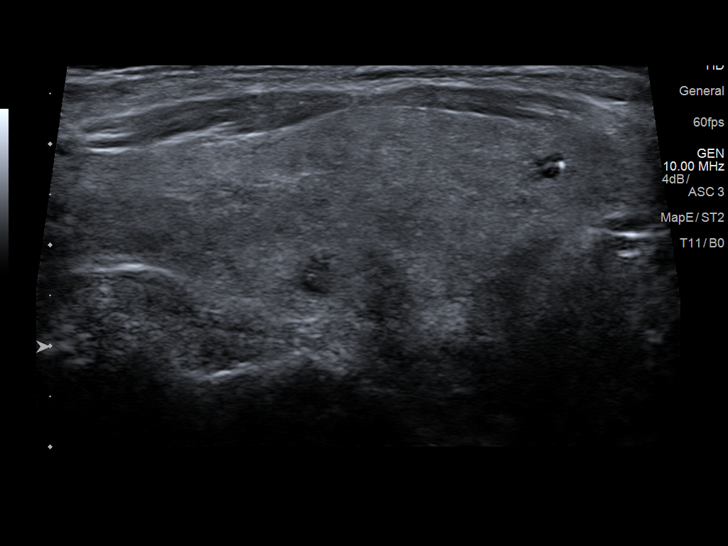
[im 16/63]
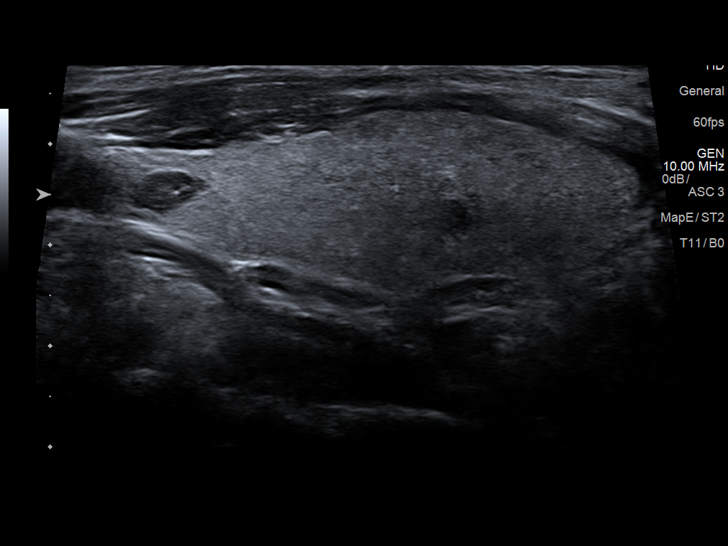
[im 21/63]
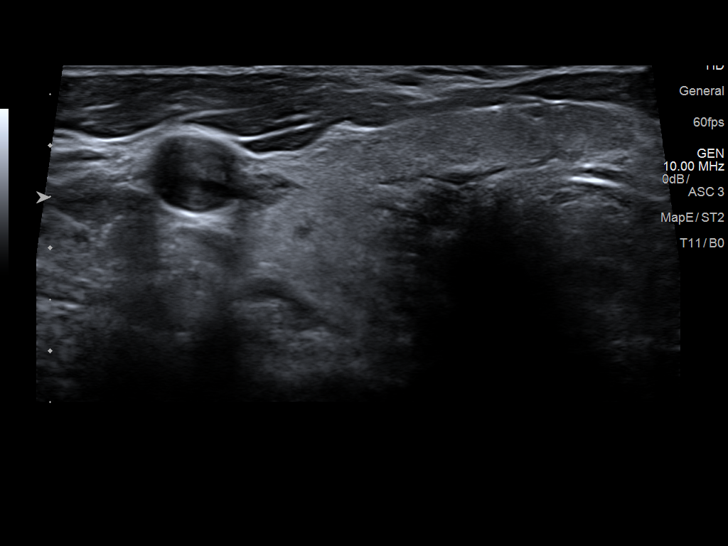
[im 26/63]
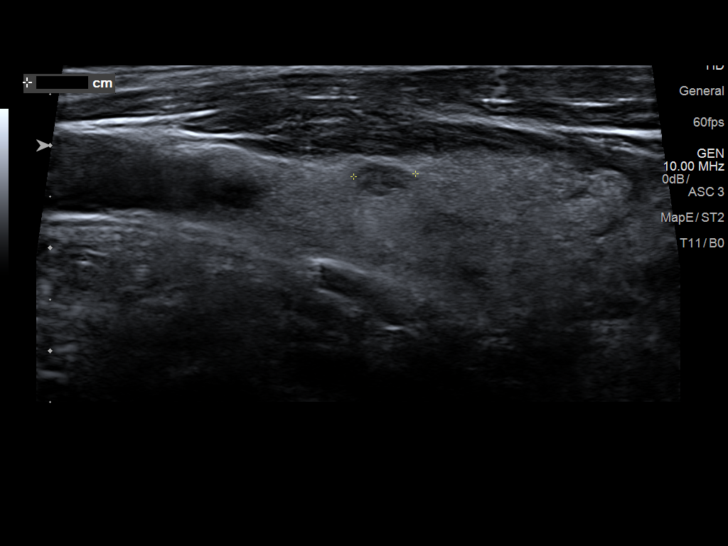
[im 32/63]
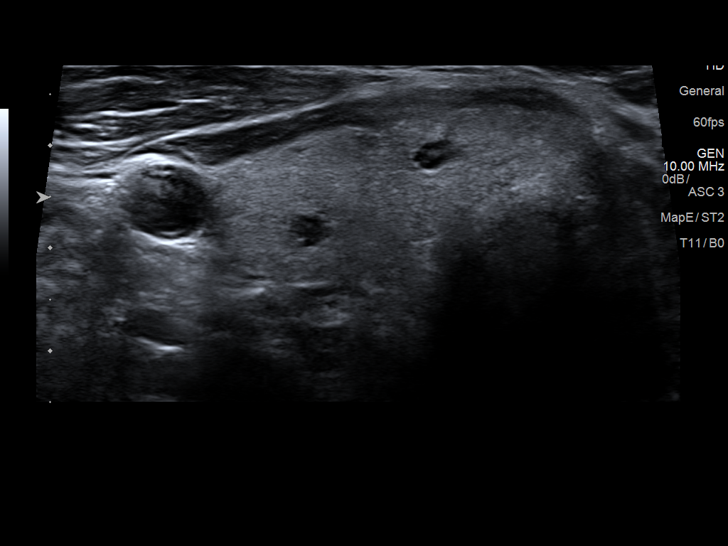
[im 37/63]
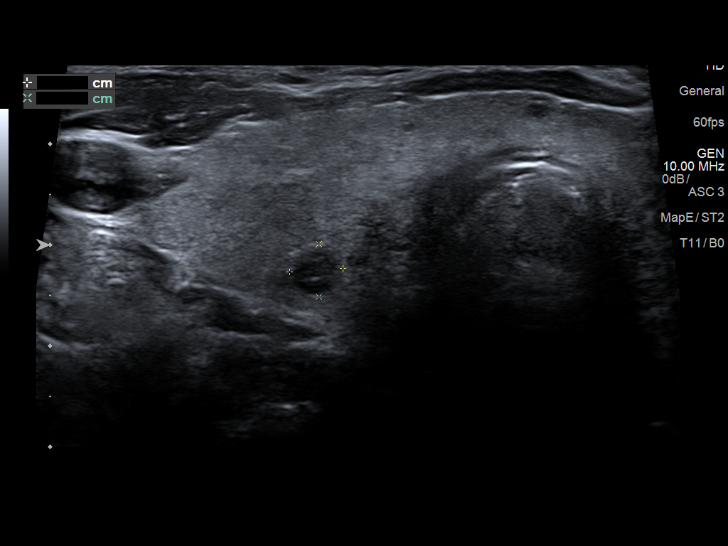
[im 42/63]
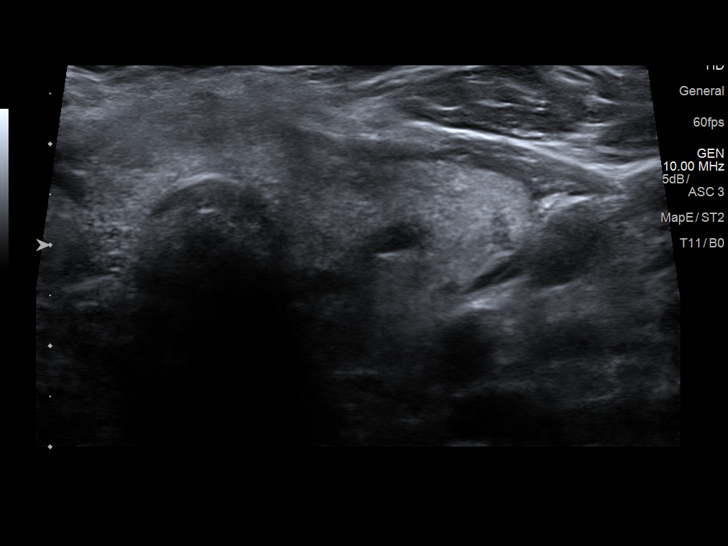
[im 47/63]
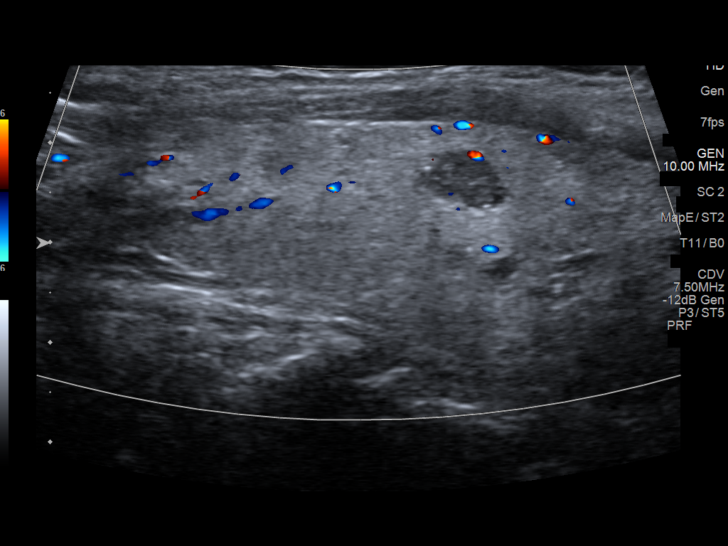
[im 52/63]
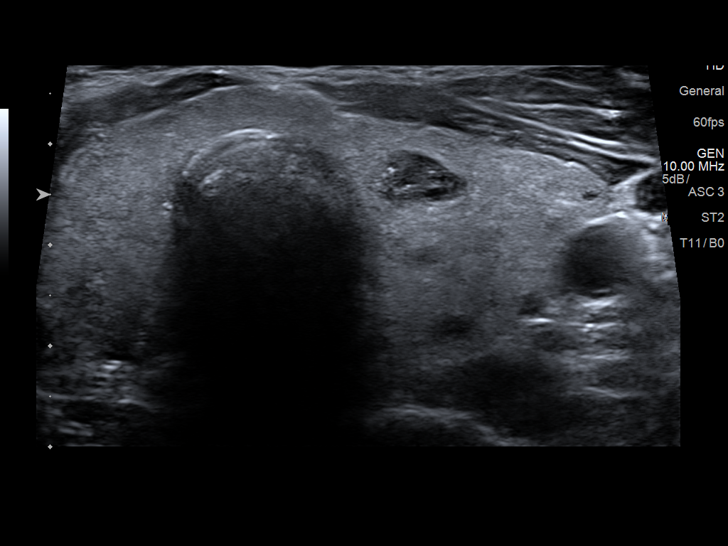
[im 57/63]
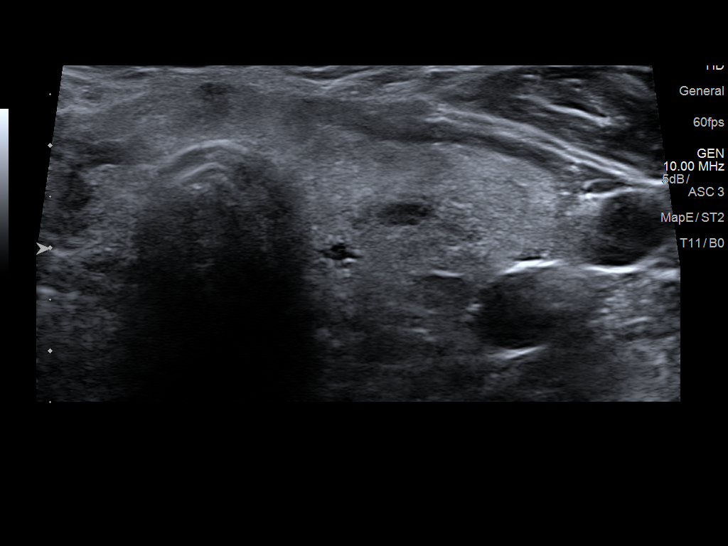
[im 63/63]
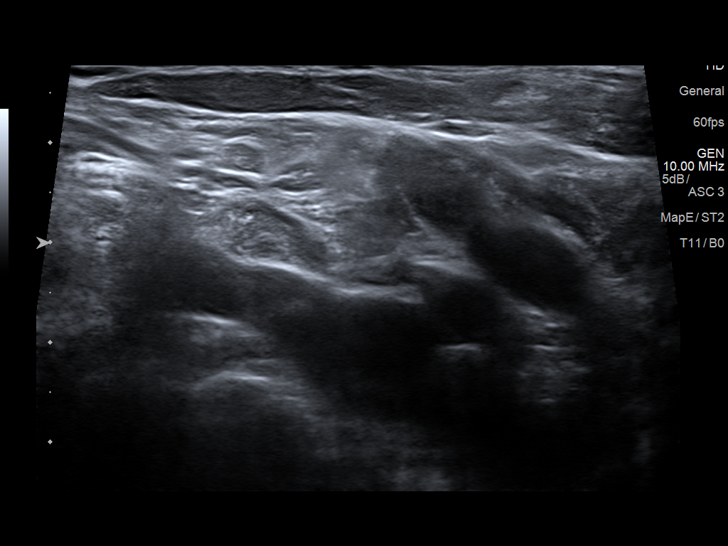

[13 of 25 positions shown; findings below may reference images not displayed]

FINDINGS: Parenchymal Echotexture: Mildly heterogenous

Isthmus: 0.5 cm

Right lobe: 5.8 x 2.2 x 2.8 cm

Left lobe: 6.3 x 2.9 x 2.6 cm

_________________________________________________________

Estimated total number of nodules >/= 1 cm: 1

Number of spongiform nodules >/=  2 cm not described below (TR1): 0

Number of mixed cystic and solid nodules >/= 1.5 cm not described
below (TR2): 0

_________________________________________________________

Several small nodules throughout the right lobe. Probable spongiform
nodule in the superior right thyroid lobe measuring up to 0.8 cm.
Hypoechoic nodule in the mid right thyroid lobe may also be
spongiform and measures 0.6 cm. Several partially cystic small
nodules in the right thyroid lobe.

Nodule # 5:

Location: Left; Mid

Maximum size: 1.0 cm; Other 2 dimensions: 0.6 x 0.9 cm

Composition: cannot determine (2)

Echogenicity: hypoechoic (2)

Shape: not taller-than-wide (0)

Margins: smooth (0)

Echogenic foci: punctate echogenic foci (3)

ACR TI-RADS total points: 7.

ACR TI-RADS risk category: TR5 (>/= 7 points).

ACR TI-RADS recommendations:

**Given size (>/= 1.0 cm) and appearance, fine needle aspiration of
this highly suspicious nodule should be considered based on TI-RADS
criteria.

_________________________________________________________

Partially cystic nodule in the inferior left thyroid lobe measuring
0.6 cm.

No enlarged lymph nodes.  No evidence for a parathyroid adenoma.
IMPRESSION: Multinodular goiter.

Majority of the nodules are spongiform or partially cystic. There is
an indeterminate nodule in the mid left thyroid lobe, nodule #5.
Composition of this nodule is uncertain and there are small
echogenic foci which are indeterminate. This is a TR 5 lesion and
meets criteria for ultrasound-guided biopsy.

No evidence for a parathyroid lesion or adenoma.

The above is in keeping with the ACR TI-RADS recommendations - [HOSPITAL] 0858;[DATE].

## 2020-04-10 ENCOUNTER — Other Ambulatory Visit (HOSPITAL_BASED_OUTPATIENT_CLINIC_OR_DEPARTMENT_OTHER): Payer: Self-pay

## 2020-04-15 ENCOUNTER — Other Ambulatory Visit (HOSPITAL_COMMUNITY): Payer: Self-pay | Admitting: Urology

## 2020-04-15 DIAGNOSIS — R972 Elevated prostate specific antigen [PSA]: Secondary | ICD-10-CM | POA: Diagnosis not present

## 2020-04-15 DIAGNOSIS — R3915 Urgency of urination: Secondary | ICD-10-CM | POA: Diagnosis not present

## 2020-04-15 MED FILL — FINASTERIDE 5 MG TABLET: 5 | 90 days supply | Qty: 90 | Fill #0

## 2020-04-19 IMAGING — US US FNA BIOPSY THYROID 1ST LESION
1 series · 13 of 17 positions shown · non-contrast
Comparison: US thyroid 08/31/17

MEDICATIONS:
5 cc 1% lidocaine

COMPLICATIONS:
None immediate.

INDICATION: Indeterminate thyroid nodule

Left mid lobe thyroid nodule
1.0 cm
EXAM:
ULTRASOUND GUIDED FINE NEEDLE ASPIRATION OF INDETERMINATE THYROID
NODULE
TECHNIQUE: Informed written consent was obtained from the patient after a
discussion of the risks, benefits and alternatives to treatment.
Questions regarding the procedure were encouraged and answered. A
timeout was performed prior to the initiation of the procedure.

[Series 1: us fna biopsy thyroid 1st lesion · 0.05mm/px · 17 acquisitions, 13 frames shown]
[im 1/17]
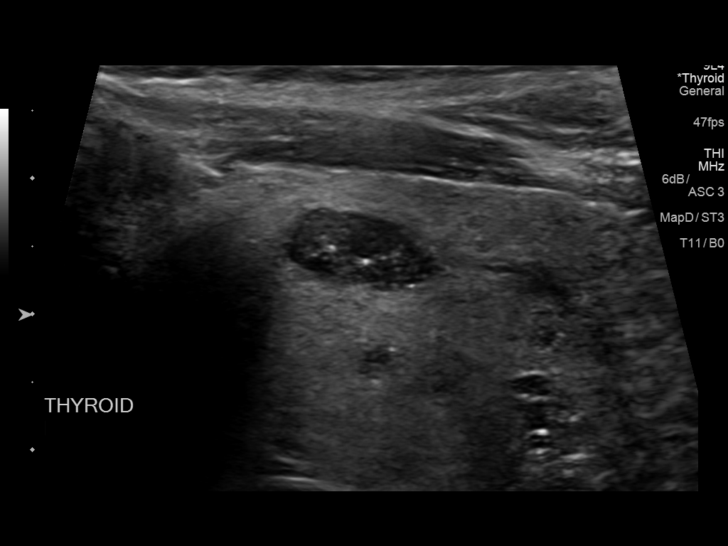
[im 2/17]
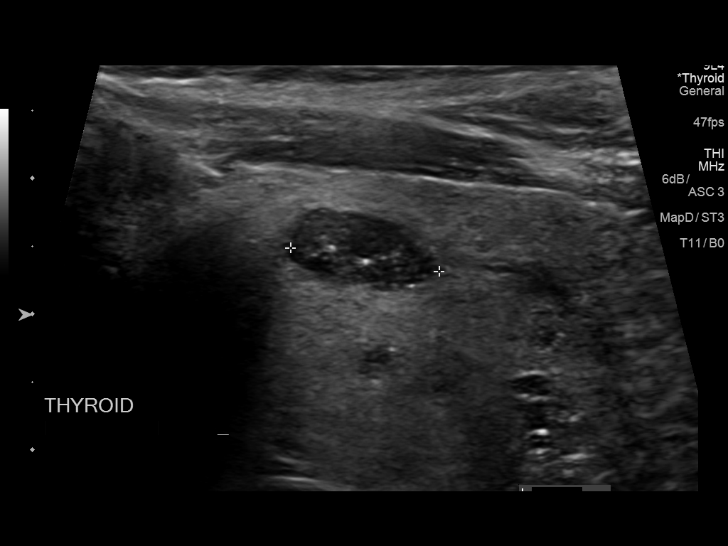
[im 4/17]
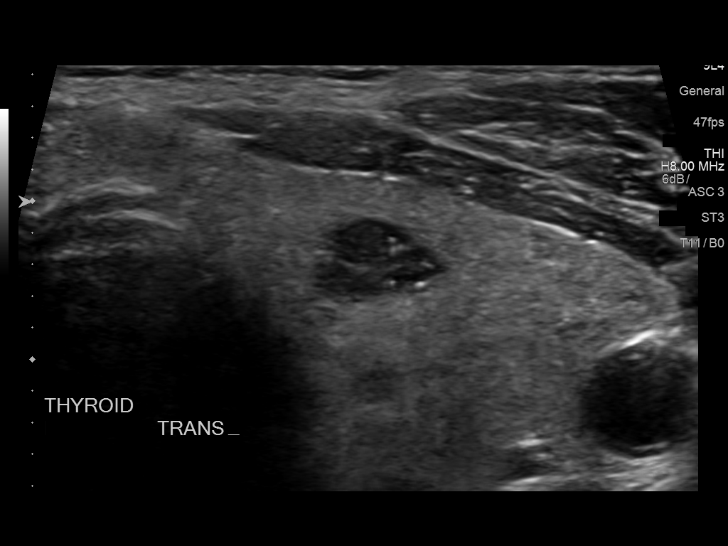
[im 5/17]
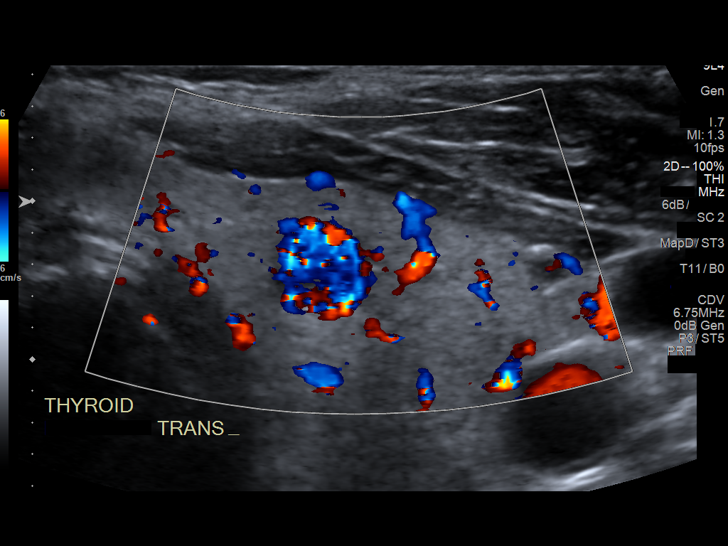
[im 6/17]
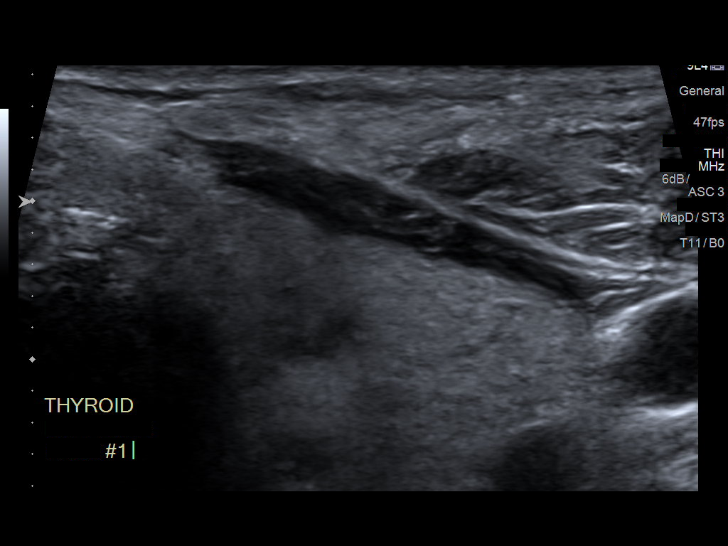
[im 8/17]
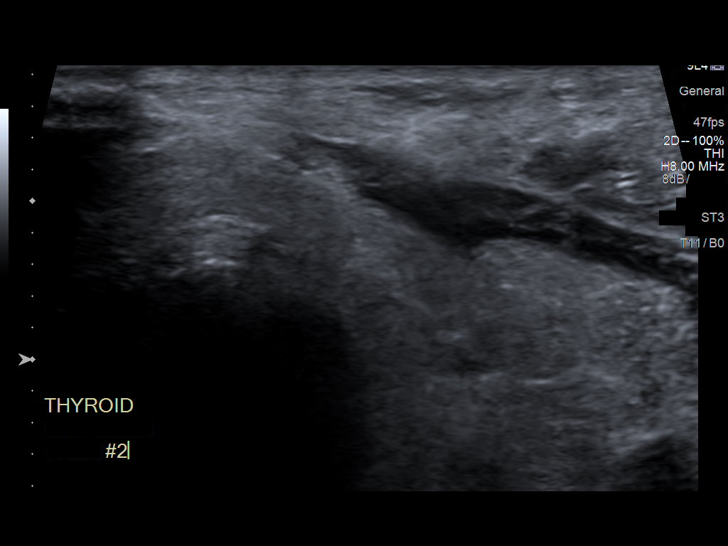
[im 9/17]
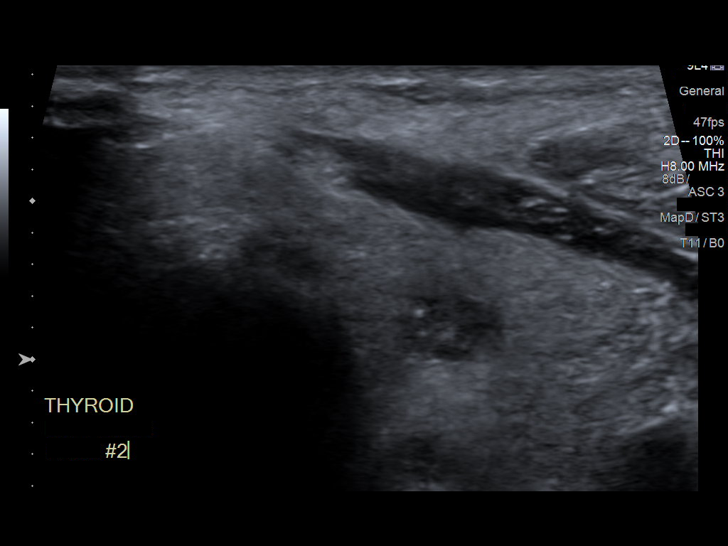
[im 10/17]
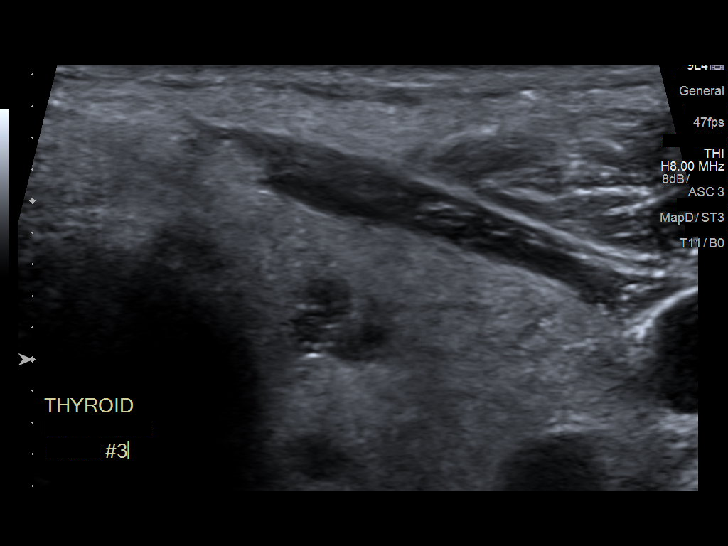
[im 12/17]
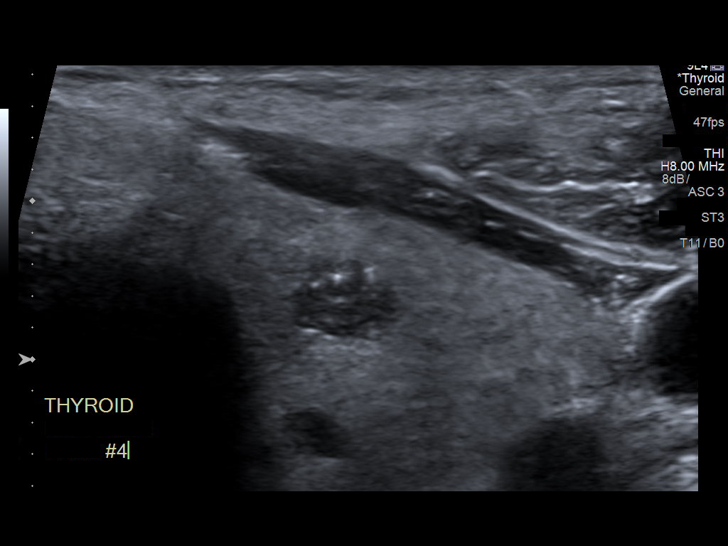
[im 13/17]
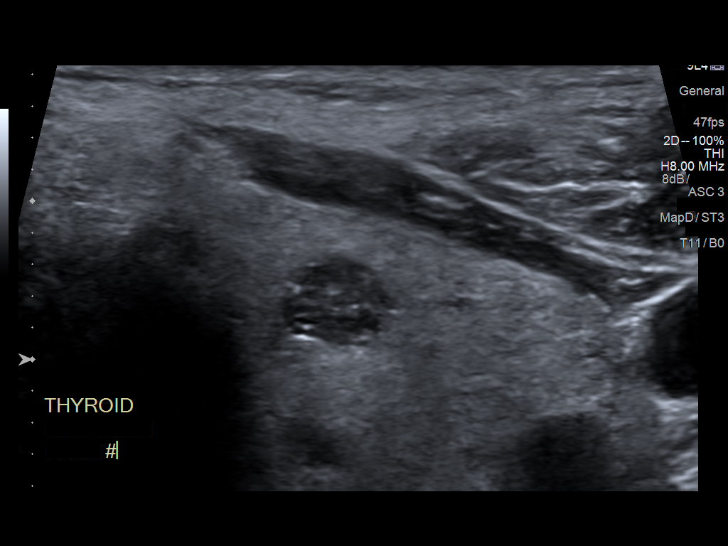
[im 14/17]
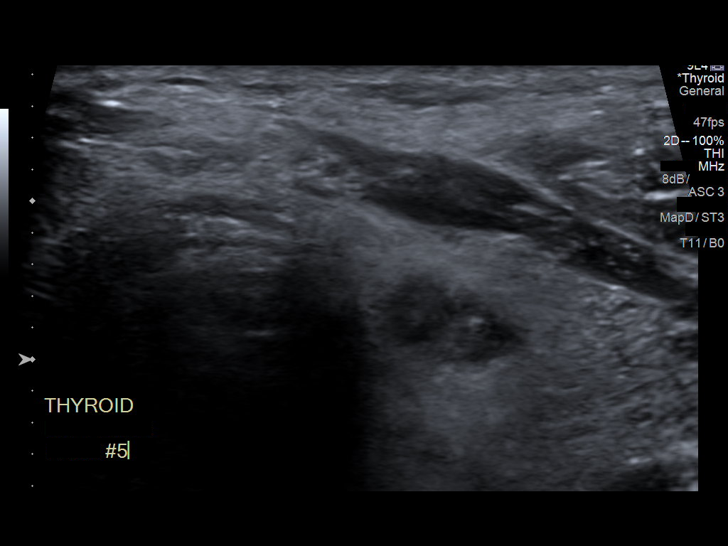
[im 16/17]
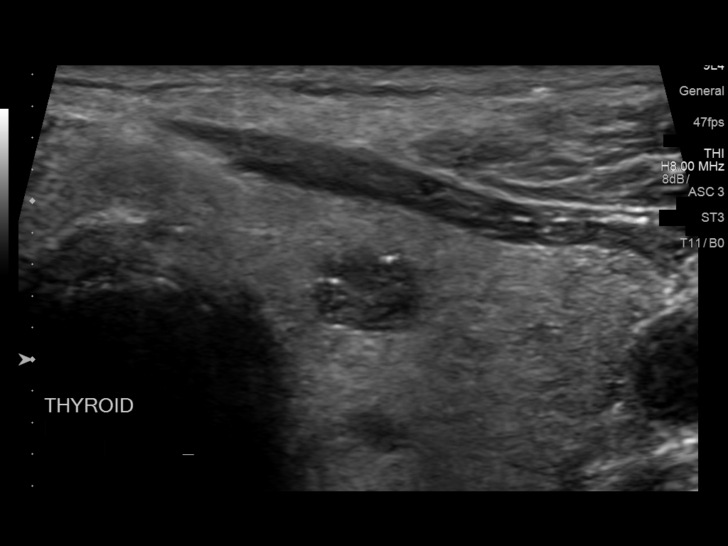
[im 17/17]
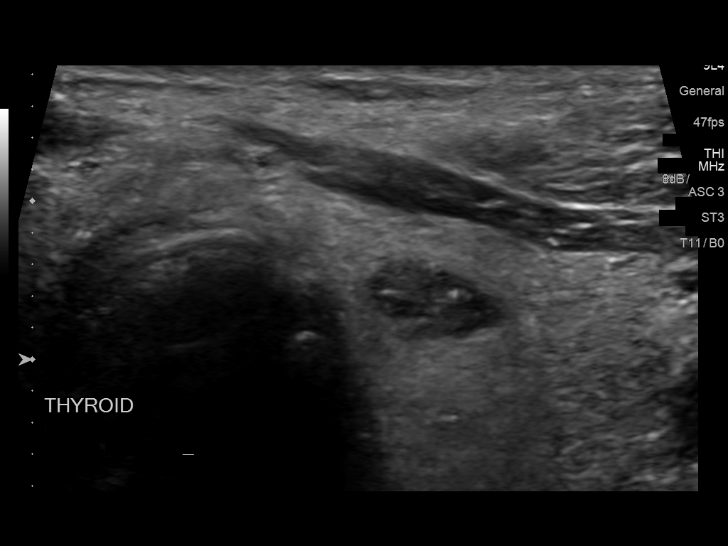

[13 of 17 positions shown; findings below may reference images not displayed]

Pre-procedural ultrasound scanning demonstrated unchanged size and
appearance of the indeterminate nodule within the left thyroid

The procedure was planned. The neck was prepped in the usual sterile
fashion, and a sterile drape was applied covering the operative
field. A timeout was performed prior to the initiation of the
procedure. Local anesthesia was provided with 1% lidocaine.

Under direct ultrasound guidance, 5 FNA biopsies were performed of
the left mid lobe thyroid nodule with a 25 gauge needle.

2 of these samples were obtained for AFIRMA per ordering AUGUSTE

Multiple ultrasound images were saved for procedural documentation
purposes. The samples were prepared and submitted to pathology.

Limited post procedural scanning was negative for hematoma or
additional complication. Dressings were placed. The patient
tolerated the above procedures procedure well without immediate
postprocedural complication.
FINDINGS: Nodule reference number based on prior diagnostic ultrasound: 5

Maximum size: 1.0 cm

Location: Left; Mid

ACR TI-RADS risk category: TR5 (>/= 7 points)

Reason for biopsy: meets ACR TI-RADS criteria

Ultrasound imaging confirms appropriate placement of the needles
within the thyroid nodule.
IMPRESSION: Technically successful ultrasound guided fine needle aspiration of
left mid lobe thyroid nodule

Read by

Elhacen Dhalluin

## 2020-04-30 MED FILL — Pioglitazone HCl Tab 30 MG (Base Equiv): ORAL | 90 days supply | Qty: 90 | Fill #0 | Status: AC

## 2020-04-30 MED FILL — Telmisartan Tab 80 MG: ORAL | 90 days supply | Qty: 90 | Fill #0 | Status: AC

## 2020-04-30 MED FILL — Ergocalciferol Cap 1.25 MG (50000 Unit): ORAL | 84 days supply | Qty: 12 | Fill #0 | Status: AC

## 2020-04-30 MED FILL — Ezetimibe-Simvastatin Tab 10-20 MG: ORAL | 90 days supply | Qty: 90 | Fill #0 | Status: AC

## 2020-05-01 ENCOUNTER — Other Ambulatory Visit (HOSPITAL_COMMUNITY): Payer: Self-pay

## 2020-05-07 ENCOUNTER — Other Ambulatory Visit (HOSPITAL_COMMUNITY): Payer: Self-pay

## 2020-06-05 ENCOUNTER — Other Ambulatory Visit (HOSPITAL_COMMUNITY): Payer: Self-pay

## 2020-06-05 DIAGNOSIS — E785 Hyperlipidemia, unspecified: Secondary | ICD-10-CM | POA: Diagnosis not present

## 2020-06-05 DIAGNOSIS — J45909 Unspecified asthma, uncomplicated: Secondary | ICD-10-CM | POA: Diagnosis not present

## 2020-06-05 DIAGNOSIS — Z23 Encounter for immunization: Secondary | ICD-10-CM | POA: Diagnosis not present

## 2020-06-05 DIAGNOSIS — E1129 Type 2 diabetes mellitus with other diabetic kidney complication: Secondary | ICD-10-CM | POA: Diagnosis not present

## 2020-06-05 DIAGNOSIS — I10 Essential (primary) hypertension: Secondary | ICD-10-CM | POA: Diagnosis not present

## 2020-06-05 MED ORDER — AMLODIPINE BESYLATE 2.5 MG PO TABS
ORAL_TABLET | ORAL | 3 refills | Status: DC
  Filled 2020-06-05: qty 90, 90d supply, fill #0
  Filled 2020-08-23: qty 90, 90d supply, fill #1
  Filled 2020-11-24: qty 90, 90d supply, fill #2
  Filled 2021-02-28: qty 90, 90d supply, fill #3

## 2020-06-06 DIAGNOSIS — D225 Melanocytic nevi of trunk: Secondary | ICD-10-CM | POA: Diagnosis not present

## 2020-06-06 DIAGNOSIS — D0439 Carcinoma in situ of skin of other parts of face: Secondary | ICD-10-CM | POA: Diagnosis not present

## 2020-06-06 DIAGNOSIS — L570 Actinic keratosis: Secondary | ICD-10-CM | POA: Diagnosis not present

## 2020-06-06 DIAGNOSIS — L814 Other melanin hyperpigmentation: Secondary | ICD-10-CM | POA: Diagnosis not present

## 2020-06-06 DIAGNOSIS — Z85828 Personal history of other malignant neoplasm of skin: Secondary | ICD-10-CM | POA: Diagnosis not present

## 2020-06-06 DIAGNOSIS — D1801 Hemangioma of skin and subcutaneous tissue: Secondary | ICD-10-CM | POA: Diagnosis not present

## 2020-06-06 DIAGNOSIS — D485 Neoplasm of uncertain behavior of skin: Secondary | ICD-10-CM | POA: Diagnosis not present

## 2020-06-06 DIAGNOSIS — L821 Other seborrheic keratosis: Secondary | ICD-10-CM | POA: Diagnosis not present

## 2020-06-06 DIAGNOSIS — L819 Disorder of pigmentation, unspecified: Secondary | ICD-10-CM | POA: Diagnosis not present

## 2020-07-15 ENCOUNTER — Other Ambulatory Visit (HOSPITAL_COMMUNITY): Payer: Self-pay

## 2020-07-15 MED FILL — Finasteride Tab 5 MG: ORAL | 90 days supply | Qty: 90 | Fill #0 | Status: AC

## 2020-07-16 ENCOUNTER — Other Ambulatory Visit (HOSPITAL_COMMUNITY): Payer: Self-pay

## 2020-07-30 MED FILL — Ergocalciferol Cap 1.25 MG (50000 Unit): ORAL | 84 days supply | Qty: 12 | Fill #1 | Status: AC

## 2020-07-31 ENCOUNTER — Other Ambulatory Visit (HOSPITAL_COMMUNITY): Payer: Self-pay

## 2020-08-18 ENCOUNTER — Other Ambulatory Visit (HOSPITAL_COMMUNITY): Payer: Self-pay

## 2020-08-19 ENCOUNTER — Other Ambulatory Visit (HOSPITAL_COMMUNITY): Payer: Self-pay

## 2020-08-19 MED ORDER — PAXLOVID 20 X 150 MG & 10 X 100MG PO TBPK
ORAL_TABLET | ORAL | 0 refills | Status: DC
  Filled 2020-08-19: qty 30, 5d supply, fill #0

## 2020-08-19 MED ORDER — CARESTART COVID-19 HOME TEST VI KIT
PACK | 0 refills | Status: DC
  Filled 2020-08-19: qty 4, 4d supply, fill #0

## 2020-08-19 MED ORDER — PAXLOVID 10 X 150 MG & 10 X 100MG PO TBPK
ORAL_TABLET | ORAL | 0 refills | Status: DC
  Filled 2020-08-19: qty 20, 5d supply, fill #0

## 2020-08-23 MED FILL — Telmisartan Tab 80 MG: ORAL | 90 days supply | Qty: 90 | Fill #1 | Status: AC

## 2020-08-23 MED FILL — Ezetimibe-Simvastatin Tab 10-20 MG: ORAL | 90 days supply | Qty: 90 | Fill #1 | Status: AC

## 2020-08-23 MED FILL — Pioglitazone HCl Tab 30 MG (Base Equiv): ORAL | 90 days supply | Qty: 90 | Fill #1 | Status: AC

## 2020-08-25 ENCOUNTER — Other Ambulatory Visit (HOSPITAL_COMMUNITY): Payer: Self-pay

## 2020-10-15 MED FILL — Finasteride Tab 5 MG: ORAL | 90 days supply | Qty: 90 | Fill #1 | Status: AC

## 2020-10-16 ENCOUNTER — Other Ambulatory Visit (HOSPITAL_COMMUNITY): Payer: Self-pay

## 2020-10-18 DIAGNOSIS — Z23 Encounter for immunization: Secondary | ICD-10-CM | POA: Diagnosis not present

## 2020-11-13 IMAGING — US US THYROID
1 series · 13 of 25 positions shown · non-contrast
Comparison: 09/19/2018 and previous

CLINICAL DATA: Nodules, follow-up. Previous FNA biopsy mid left
nodule 09/15/2017.

EXAM:
THYROID ULTRASOUND
TECHNIQUE: Ultrasound examination of the thyroid gland and adjacent soft
tissues was performed.

[Series 1: us thyroid · 0.07mm/px · 13 of 55 slices shown]
[im 1/55]
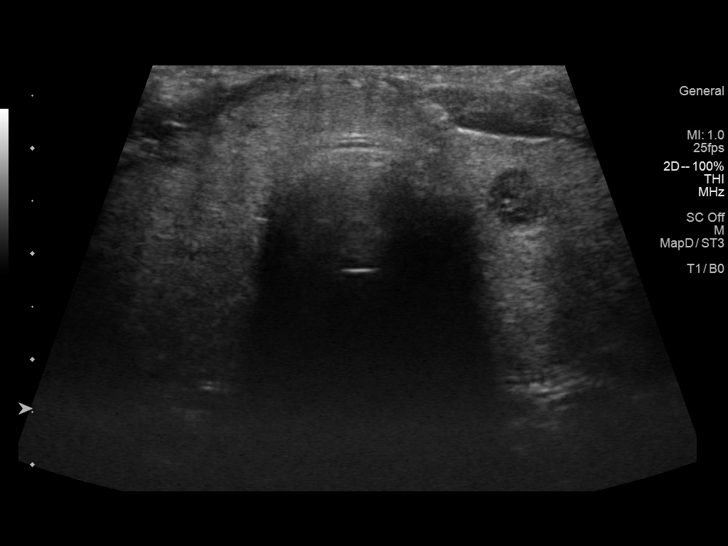
[im 5/55]
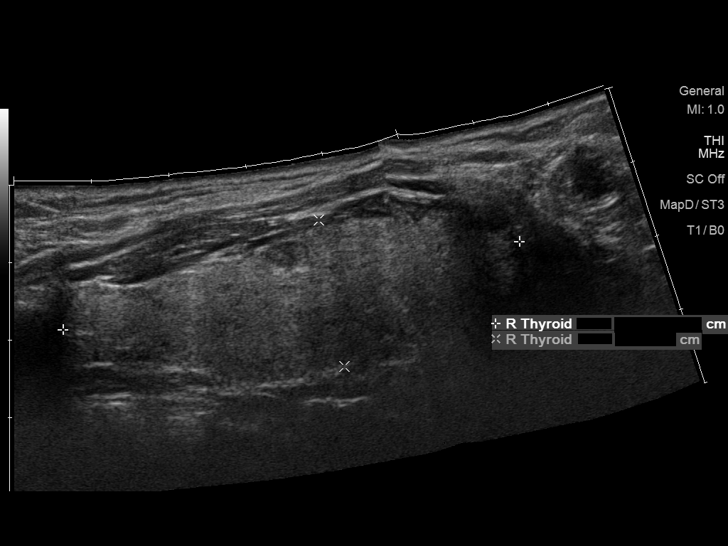
[im 10/55]
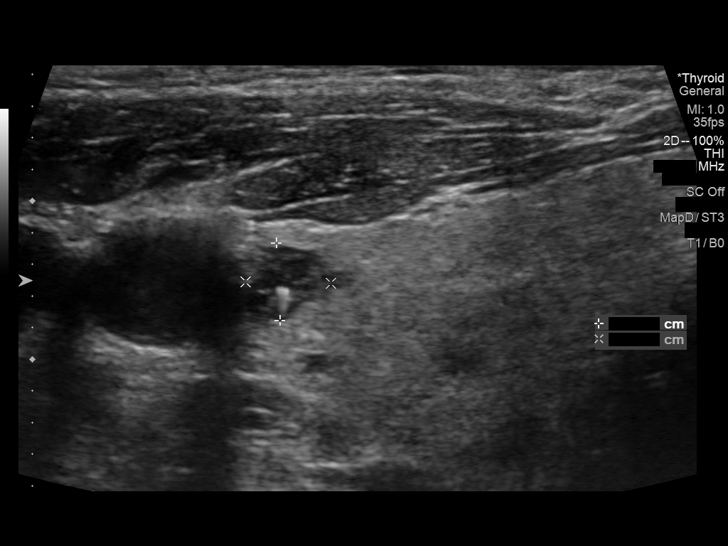
[im 14/55]
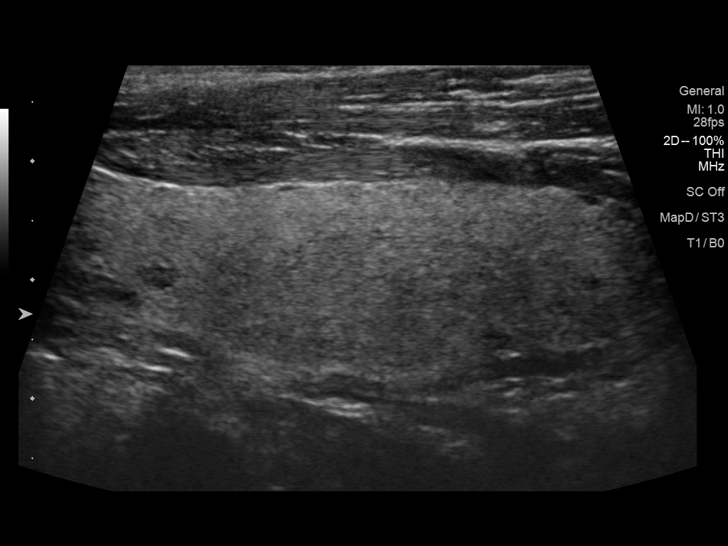
[im 19/55]
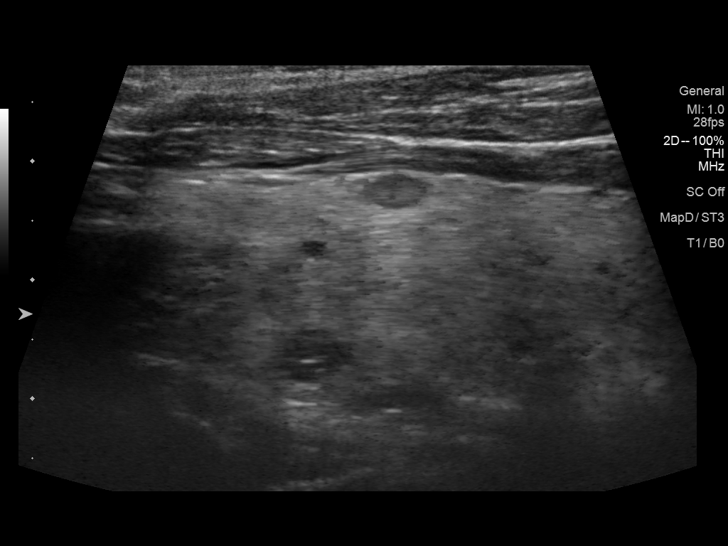
[im 23/55]
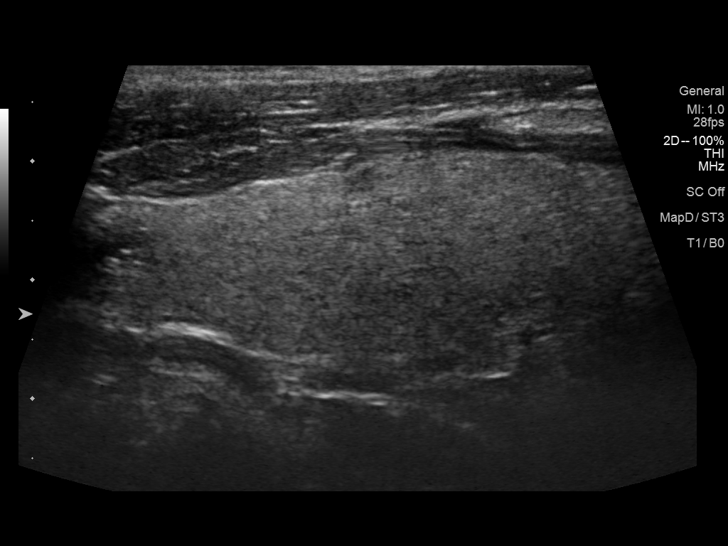
[im 28/55]
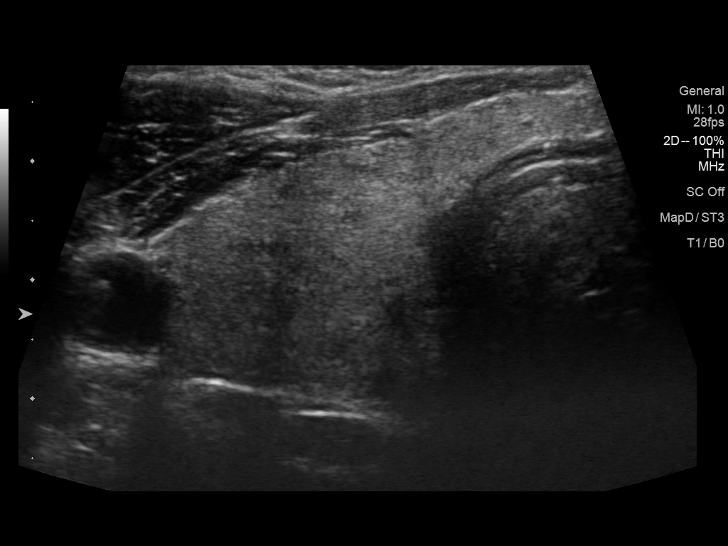
[im 32/55]
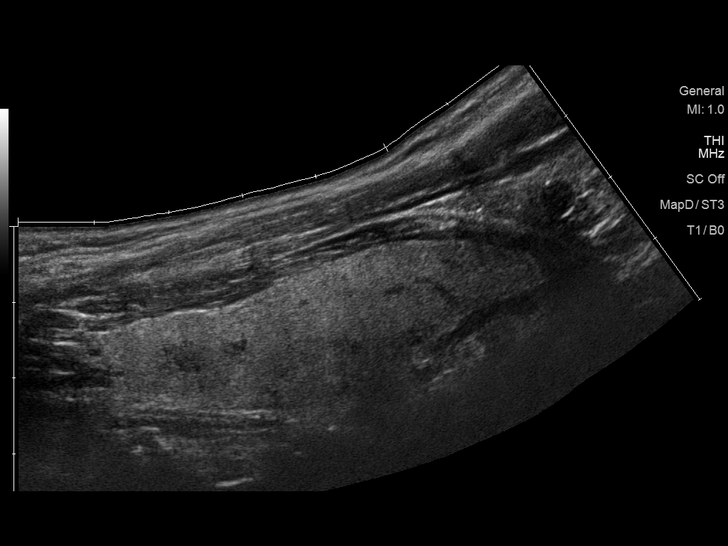
[im 37/55]
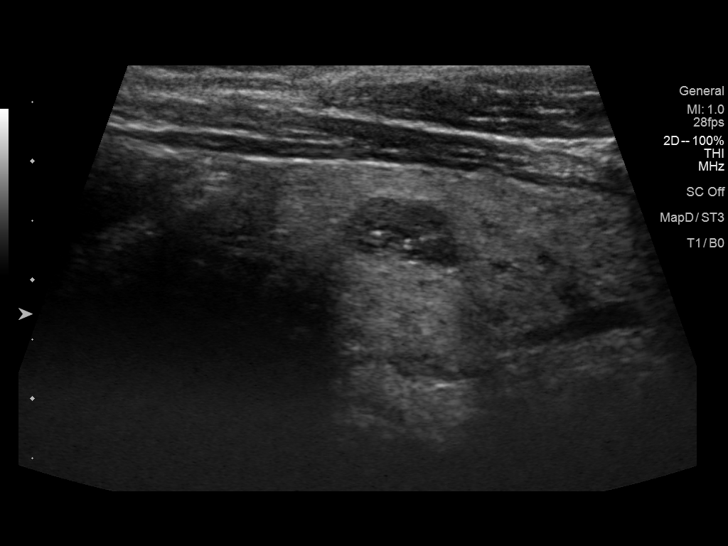
[im 41/55]
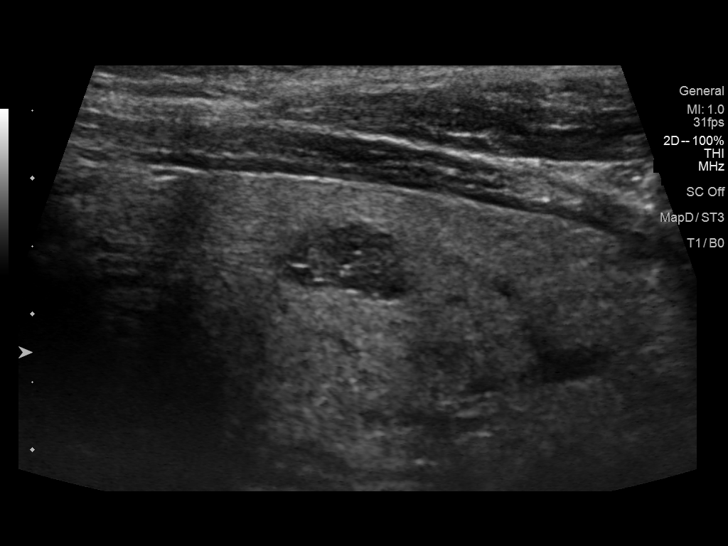
[im 46/55]
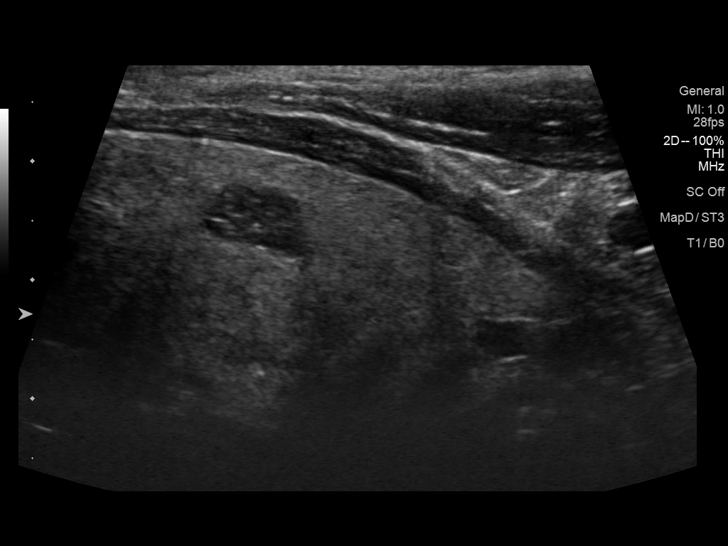
[im 50/55]
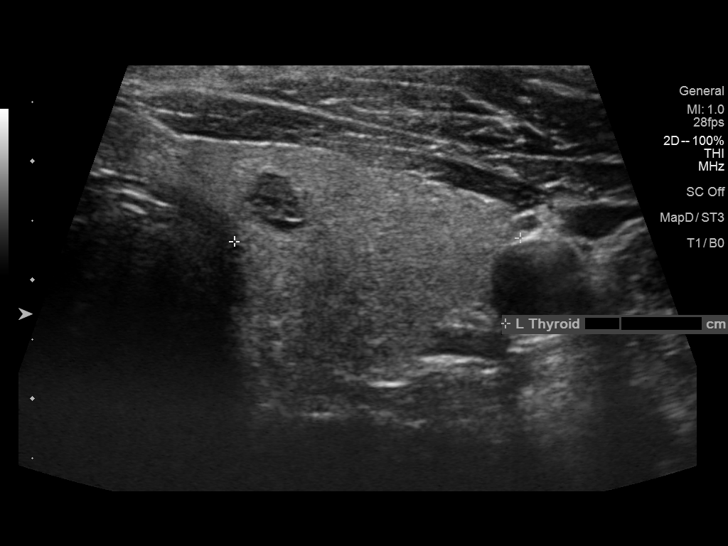
[im 55/55]
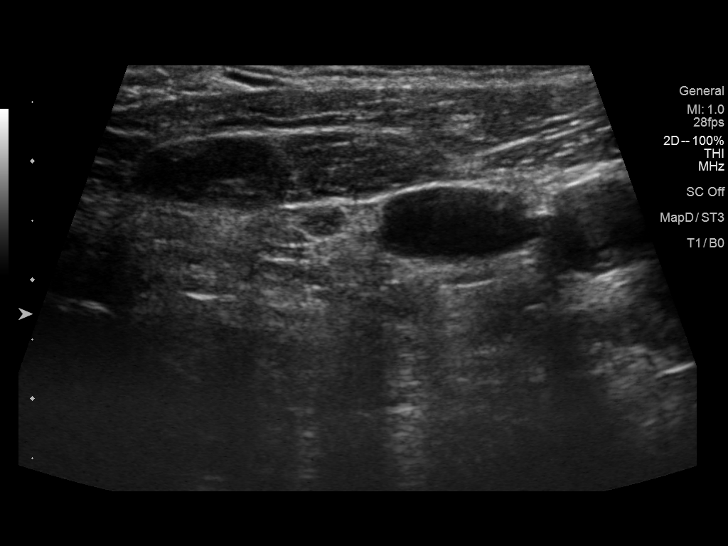

[13 of 25 positions shown; findings below may reference images not displayed]

FINDINGS: Parenchymal Echotexture: Mildly heterogenous

Isthmus: 0.7 cm thickness, previously

Right lobe: 6 x 1.9 x 2.3 cm, previously 6 x 1.9 x

Left lobe: 6.2 x 1.9 x 2.4 cm, previously 6.1 x 2.1 x

_________________________________________________________

Estimated total number of nodules >/= 1 cm: 1

Number of spongiform nodules >/=  2 cm not described below (TR1): 0

Number of mixed cystic and solid nodules >/= 1.5 cm not described
below (TR2): 0

_________________________________________________________

Nodule # 1: 0.8 cm complex cyst, superior right, stable; This nodule
does NOT meet TI-RADS criteria for biopsy or dedicated follow-up.

Nodule # 2: 0.7 cm hypoechoic nodule without calcifications, mid
right, stable; This nodule does NOT meet TI-RADS criteria for biopsy
or dedicated follow-up.

Nodule # 3: 0.6 cm hypoechoic nodule without calcifications,
posterior mid right; This nodule does NOT meet TI-RADS criteria for
biopsy or dedicated follow-up.

Nodule # 4: 1 x 0.8 x 0.5 cm mid left nodule, stable; this was
previously biopsied. No follow-up recommended assuming benign
cytology.
IMPRESSION: 1. Stable thyromegaly with small bilateral nodules. None currently
meets criteria for biopsy or follow-up.

The above is in keeping with the ACR TI-RADS recommendations - [HOSPITAL] 4575;[DATE].

## 2020-11-24 ENCOUNTER — Other Ambulatory Visit (HOSPITAL_COMMUNITY): Payer: Self-pay

## 2020-11-24 MED FILL — Ergocalciferol Cap 1.25 MG (50000 Unit): ORAL | 84 days supply | Qty: 12 | Fill #2 | Status: AC

## 2020-11-25 ENCOUNTER — Other Ambulatory Visit (HOSPITAL_COMMUNITY): Payer: Self-pay

## 2020-11-25 DIAGNOSIS — D2262 Melanocytic nevi of left upper limb, including shoulder: Secondary | ICD-10-CM | POA: Diagnosis not present

## 2020-11-25 DIAGNOSIS — Z85828 Personal history of other malignant neoplasm of skin: Secondary | ICD-10-CM | POA: Diagnosis not present

## 2020-11-25 DIAGNOSIS — D225 Melanocytic nevi of trunk: Secondary | ICD-10-CM | POA: Diagnosis not present

## 2020-11-25 DIAGNOSIS — L570 Actinic keratosis: Secondary | ICD-10-CM | POA: Diagnosis not present

## 2020-11-25 DIAGNOSIS — D2272 Melanocytic nevi of left lower limb, including hip: Secondary | ICD-10-CM | POA: Diagnosis not present

## 2020-11-25 DIAGNOSIS — D485 Neoplasm of uncertain behavior of skin: Secondary | ICD-10-CM | POA: Diagnosis not present

## 2020-11-25 DIAGNOSIS — D2271 Melanocytic nevi of right lower limb, including hip: Secondary | ICD-10-CM | POA: Diagnosis not present

## 2020-11-25 DIAGNOSIS — D1801 Hemangioma of skin and subcutaneous tissue: Secondary | ICD-10-CM | POA: Diagnosis not present

## 2020-11-25 DIAGNOSIS — L821 Other seborrheic keratosis: Secondary | ICD-10-CM | POA: Diagnosis not present

## 2020-11-25 DIAGNOSIS — D045 Carcinoma in situ of skin of trunk: Secondary | ICD-10-CM | POA: Diagnosis not present

## 2020-11-26 ENCOUNTER — Other Ambulatory Visit (HOSPITAL_COMMUNITY): Payer: Self-pay

## 2020-11-26 MED ORDER — EZETIMIBE-SIMVASTATIN 10-20 MG PO TABS
1.0000 | ORAL_TABLET | Freq: Every day | ORAL | 3 refills | Status: DC
  Filled 2020-11-26: qty 90, 90d supply, fill #0
  Filled 2021-02-21: qty 90, 90d supply, fill #1
  Filled 2021-05-20: qty 90, 90d supply, fill #2
  Filled 2021-08-14: qty 90, 90d supply, fill #3

## 2020-11-26 MED ORDER — TELMISARTAN 80 MG PO TABS
80.0000 mg | ORAL_TABLET | Freq: Every day | ORAL | 2 refills | Status: DC
  Filled 2020-11-26: qty 90, 90d supply, fill #0
  Filled 2021-02-21: qty 90, 90d supply, fill #1
  Filled 2021-05-20: qty 90, 90d supply, fill #2

## 2020-11-26 MED ORDER — PIOGLITAZONE HCL 30 MG PO TABS
30.0000 mg | ORAL_TABLET | Freq: Every day | ORAL | 2 refills | Status: DC
  Filled 2020-11-26: qty 90, 90d supply, fill #0
  Filled 2021-02-21: qty 90, 90d supply, fill #1
  Filled 2021-05-20: qty 90, 90d supply, fill #2

## 2020-11-27 ENCOUNTER — Other Ambulatory Visit (HOSPITAL_COMMUNITY): Payer: Self-pay

## 2021-01-15 DIAGNOSIS — E041 Nontoxic single thyroid nodule: Secondary | ICD-10-CM | POA: Diagnosis not present

## 2021-01-15 DIAGNOSIS — I10 Essential (primary) hypertension: Secondary | ICD-10-CM | POA: Diagnosis not present

## 2021-01-15 DIAGNOSIS — Z125 Encounter for screening for malignant neoplasm of prostate: Secondary | ICD-10-CM | POA: Diagnosis not present

## 2021-01-15 DIAGNOSIS — E1129 Type 2 diabetes mellitus with other diabetic kidney complication: Secondary | ICD-10-CM | POA: Diagnosis not present

## 2021-01-15 DIAGNOSIS — E785 Hyperlipidemia, unspecified: Secondary | ICD-10-CM | POA: Diagnosis not present

## 2021-01-17 MED FILL — Finasteride Tab 5 MG: ORAL | 90 days supply | Qty: 90 | Fill #2 | Status: AC

## 2021-01-18 ENCOUNTER — Other Ambulatory Visit (HOSPITAL_COMMUNITY): Payer: Self-pay

## 2021-01-19 ENCOUNTER — Other Ambulatory Visit (HOSPITAL_COMMUNITY): Payer: Self-pay

## 2021-01-21 DIAGNOSIS — H52203 Unspecified astigmatism, bilateral: Secondary | ICD-10-CM | POA: Diagnosis not present

## 2021-01-22 DIAGNOSIS — E785 Hyperlipidemia, unspecified: Secondary | ICD-10-CM | POA: Diagnosis not present

## 2021-01-22 DIAGNOSIS — Z1339 Encounter for screening examination for other mental health and behavioral disorders: Secondary | ICD-10-CM | POA: Diagnosis not present

## 2021-01-22 DIAGNOSIS — E119 Type 2 diabetes mellitus without complications: Secondary | ICD-10-CM | POA: Diagnosis not present

## 2021-01-22 DIAGNOSIS — R82998 Other abnormal findings in urine: Secondary | ICD-10-CM | POA: Diagnosis not present

## 2021-01-22 DIAGNOSIS — Z0000 Encounter for general adult medical examination without abnormal findings: Secondary | ICD-10-CM | POA: Diagnosis not present

## 2021-01-22 DIAGNOSIS — J45909 Unspecified asthma, uncomplicated: Secondary | ICD-10-CM | POA: Diagnosis not present

## 2021-01-22 DIAGNOSIS — Z1331 Encounter for screening for depression: Secondary | ICD-10-CM | POA: Diagnosis not present

## 2021-01-22 DIAGNOSIS — I10 Essential (primary) hypertension: Secondary | ICD-10-CM | POA: Diagnosis not present

## 2021-02-21 ENCOUNTER — Other Ambulatory Visit (HOSPITAL_COMMUNITY): Payer: Self-pay

## 2021-02-23 ENCOUNTER — Other Ambulatory Visit (HOSPITAL_COMMUNITY): Payer: Self-pay

## 2021-02-23 MED ORDER — VITAMIN D (ERGOCALCIFEROL) 1.25 MG (50000 UNIT) PO CAPS
ORAL_CAPSULE | ORAL | 3 refills | Status: DC
  Filled 2021-02-23: qty 12, 84d supply, fill #0
  Filled 2021-05-20: qty 12, 84d supply, fill #1
  Filled 2021-08-14: qty 12, 84d supply, fill #2
  Filled 2021-11-06: qty 12, 84d supply, fill #3

## 2021-03-02 ENCOUNTER — Other Ambulatory Visit (HOSPITAL_COMMUNITY): Payer: Self-pay

## 2021-04-28 ENCOUNTER — Other Ambulatory Visit (HOSPITAL_COMMUNITY): Payer: Self-pay

## 2021-04-28 DIAGNOSIS — R3915 Urgency of urination: Secondary | ICD-10-CM | POA: Diagnosis not present

## 2021-04-28 MED ORDER — FINASTERIDE 5 MG PO TABS
ORAL_TABLET | ORAL | 3 refills | Status: DC
  Filled 2021-04-28: qty 90, 90d supply, fill #0
  Filled 2021-07-27: qty 90, 90d supply, fill #1
  Filled 2021-10-19: qty 90, 90d supply, fill #2
  Filled 2022-01-13: qty 90, 90d supply, fill #3

## 2021-05-20 ENCOUNTER — Other Ambulatory Visit (HOSPITAL_COMMUNITY): Payer: Self-pay

## 2021-05-20 MED ORDER — AMLODIPINE BESYLATE 2.5 MG PO TABS
2.5000 mg | ORAL_TABLET | Freq: Every day | ORAL | 3 refills | Status: DC
  Filled 2021-05-20: qty 90, 90d supply, fill #0
  Filled 2021-08-14: qty 90, 90d supply, fill #1
  Filled 2021-11-10: qty 90, 90d supply, fill #2
  Filled 2022-02-08 – 2022-02-09 (×2): qty 90, 90d supply, fill #3

## 2021-05-21 ENCOUNTER — Other Ambulatory Visit (HOSPITAL_COMMUNITY): Payer: Self-pay

## 2021-07-06 DIAGNOSIS — D2372 Other benign neoplasm of skin of left lower limb, including hip: Secondary | ICD-10-CM | POA: Diagnosis not present

## 2021-07-06 DIAGNOSIS — Z85828 Personal history of other malignant neoplasm of skin: Secondary | ICD-10-CM | POA: Diagnosis not present

## 2021-07-06 DIAGNOSIS — D2361 Other benign neoplasm of skin of right upper limb, including shoulder: Secondary | ICD-10-CM | POA: Diagnosis not present

## 2021-07-06 DIAGNOSIS — L570 Actinic keratosis: Secondary | ICD-10-CM | POA: Diagnosis not present

## 2021-07-06 DIAGNOSIS — D225 Melanocytic nevi of trunk: Secondary | ICD-10-CM | POA: Diagnosis not present

## 2021-07-06 DIAGNOSIS — L821 Other seborrheic keratosis: Secondary | ICD-10-CM | POA: Diagnosis not present

## 2021-07-27 ENCOUNTER — Other Ambulatory Visit (HOSPITAL_COMMUNITY): Payer: Self-pay

## 2021-07-27 DIAGNOSIS — I10 Essential (primary) hypertension: Secondary | ICD-10-CM | POA: Diagnosis not present

## 2021-07-27 DIAGNOSIS — Z1339 Encounter for screening examination for other mental health and behavioral disorders: Secondary | ICD-10-CM | POA: Diagnosis not present

## 2021-07-27 DIAGNOSIS — J45909 Unspecified asthma, uncomplicated: Secondary | ICD-10-CM | POA: Diagnosis not present

## 2021-07-27 DIAGNOSIS — E1129 Type 2 diabetes mellitus with other diabetic kidney complication: Secondary | ICD-10-CM | POA: Diagnosis not present

## 2021-07-27 DIAGNOSIS — E785 Hyperlipidemia, unspecified: Secondary | ICD-10-CM | POA: Diagnosis not present

## 2021-08-14 ENCOUNTER — Other Ambulatory Visit (HOSPITAL_COMMUNITY): Payer: Self-pay

## 2021-08-15 ENCOUNTER — Other Ambulatory Visit (HOSPITAL_COMMUNITY): Payer: Self-pay

## 2021-08-17 ENCOUNTER — Other Ambulatory Visit (HOSPITAL_COMMUNITY): Payer: Self-pay

## 2021-08-17 MED ORDER — TELMISARTAN 80 MG PO TABS
80.0000 mg | ORAL_TABLET | Freq: Every day | ORAL | 3 refills | Status: DC
  Filled 2021-08-17: qty 90, 90d supply, fill #0

## 2021-08-17 MED ORDER — PIOGLITAZONE HCL 30 MG PO TABS
30.0000 mg | ORAL_TABLET | Freq: Every day | ORAL | 3 refills | Status: DC
  Filled 2021-08-17: qty 90, 90d supply, fill #0

## 2021-09-08 ENCOUNTER — Other Ambulatory Visit (HOSPITAL_COMMUNITY): Payer: Self-pay

## 2021-09-08 MED ORDER — AMOXICILLIN 500 MG PO CAPS
ORAL_CAPSULE | ORAL | 1 refills | Status: DC
  Filled 2021-09-08: qty 30, 10d supply, fill #0

## 2021-09-10 ENCOUNTER — Ambulatory Visit: Payer: 59 | Admitting: Podiatry

## 2021-09-10 DIAGNOSIS — D492 Neoplasm of unspecified behavior of bone, soft tissue, and skin: Secondary | ICD-10-CM | POA: Diagnosis not present

## 2021-09-10 DIAGNOSIS — M216X2 Other acquired deformities of left foot: Secondary | ICD-10-CM | POA: Diagnosis not present

## 2021-09-10 DIAGNOSIS — M216X1 Other acquired deformities of right foot: Secondary | ICD-10-CM | POA: Diagnosis not present

## 2021-09-10 DIAGNOSIS — Q828 Other specified congenital malformations of skin: Secondary | ICD-10-CM

## 2021-09-10 NOTE — Patient Instructions (Signed)
Keep the bandage on for 24 hours. At that time, remove and clean with soap and water. If it hurts or burns before 24 hours go ahead and remove the bandage and wash with soap and water. Keep the area clean. If there is any blistering cover with antibiotic ointment and a bandage. Monitor for any redness, drainage, or other signs of infection. Call the office if any are to occur. If you have any questions, please call the office at [phone].  For inserts I like [employer]

## 2021-09-10 NOTE — Progress Notes (Signed)
Subjective:   Patient ID: Kenneth Chavez, male   DOB: 76 y.o.   MRN: [id number]   HPI Chief Complaint  Patient presents with   Plantar Warts    Patient came in today for plantar wart bilaterally    He has had calluses for about 10 years. He has tried scraping them himself. Thy cause discomfort at times with certain pressure but not a lot.    Review of Systems  All other systems reviewed and are negative.  Past Medical History:  Diagnosis Date   Diabetes mellitus without complication ([place])    type 2   Hypertension     Past Surgical History:  Procedure Laterality Date   PARATHYROIDECTOMY Right 11/10/2017   Procedure: RIGHT INFERIOR PARATHYROIDECTOMY;  Surgeon: Armandina Gemma, MD;  Location: [facility];  Service: General;  Laterality: Right;   TRANSURETHRAL RESECTION OF PROSTATE N/A 01/04/2018   Procedure: TRANSURETHRAL RESECTION OF THE PROSTATE (TURP);  Surgeon: Alexis Frock, MD;  Location: [facility];  Service: Urology;  Laterality: N/A;  75 MINS     Current Outpatient Medications:    amLODipine (NORVASC) 2.5 MG tablet, Take 1 tablet by mouth once a day, Disp: 90 tablet, Rfl: 3   ezetimibe-simvastatin (VYTORIN) 10-20 MG tablet, Take 1 tablet by mouth daily., Disp: , Rfl: 2   pioglitazone (ACTOS) 30 MG tablet, Take 30 mg by mouth daily., Disp: , Rfl:    telmisartan (MICARDIS) 80 MG tablet, Take 80 mg by mouth daily., Disp: , Rfl:    Vitamin D, Ergocalciferol, (DRISDOL) 1.25 MG (50000 UNIT) CAPS capsule, TAKE 1 CAPSULE BY MOUTH EACH WEEK, Disp: 12 capsule, Rfl: 3  No Known Allergies        Objective:  Physical Exam  General: AAO x3, NAD  Dermatological: Pinpoint annular hyperkeratotic lesions present bilaterally submetatarsal 5x2 on the right and x1 on the left.  There is no underlying ulceration drainage or signs of infection.  There is no capillary bleeding noted.  No underlying foreign body.  Vascular: Dorsalis Pedis artery and Posterior Tibial artery  pedal pulses are 2/4 bilateral with immedate capillary fill time.  There is no pain with calf compression, swelling, warmth, erythema.   Neruologic: Grossly intact via light touch bilateral.   Musculoskeletal: Prominent metatarsal heads.  Muscular strength 5/5 in all groups tested bilateral.  Gait: Unassisted, Nonantalgic.    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  Skin was cleaned with alcohol pad was placed followed by salicylic acid and a bandage.  Postprocedure instructions discussed.  Monitor for any signs or symptoms of infection. -Discussed shoe modifications and good arch support and padding.  Trula Slade DPM

## 2021-10-19 ENCOUNTER — Other Ambulatory Visit (HOSPITAL_COMMUNITY): Payer: Self-pay

## 2021-10-20 ENCOUNTER — Other Ambulatory Visit (HOSPITAL_COMMUNITY): Payer: Self-pay

## 2021-11-06 ENCOUNTER — Other Ambulatory Visit (HOSPITAL_COMMUNITY): Payer: Self-pay

## 2021-11-06 DIAGNOSIS — Z23 Encounter for immunization: Secondary | ICD-10-CM | POA: Diagnosis not present

## 2021-11-06 DIAGNOSIS — R519 Headache, unspecified: Secondary | ICD-10-CM | POA: Diagnosis not present

## 2021-11-06 DIAGNOSIS — E1129 Type 2 diabetes mellitus with other diabetic kidney complication: Secondary | ICD-10-CM | POA: Diagnosis not present

## 2021-11-06 DIAGNOSIS — I10 Essential (primary) hypertension: Secondary | ICD-10-CM | POA: Diagnosis not present

## 2021-11-10 ENCOUNTER — Other Ambulatory Visit (HOSPITAL_COMMUNITY): Payer: Self-pay

## 2021-11-10 MED ORDER — EZETIMIBE-SIMVASTATIN 10-20 MG PO TABS
1.0000 | ORAL_TABLET | Freq: Every day | ORAL | 4 refills | Status: DC
  Filled 2021-11-10: qty 90, 90d supply, fill #0
  Filled 2022-02-08 – 2022-02-09 (×2): qty 90, 90d supply, fill #1
  Filled 2022-05-05: qty 90, 90d supply, fill #2
  Filled 2022-08-07: qty 90, 90d supply, fill #3
  Filled 2022-11-08: qty 90, 90d supply, fill #4

## 2021-11-10 MED ORDER — PIOGLITAZONE HCL 30 MG PO TABS
30.0000 mg | ORAL_TABLET | Freq: Every day | ORAL | 3 refills | Status: DC
  Filled 2021-11-10: qty 90, 90d supply, fill #0
  Filled 2022-02-08 – 2022-02-09 (×2): qty 90, 90d supply, fill #1
  Filled 2022-05-05: qty 90, 90d supply, fill #2
  Filled 2022-08-07: qty 90, 90d supply, fill #3

## 2021-11-10 MED ORDER — TELMISARTAN 80 MG PO TABS
80.0000 mg | ORAL_TABLET | Freq: Every day | ORAL | 3 refills | Status: DC
  Filled 2021-11-10: qty 90, 90d supply, fill #0
  Filled 2022-02-08 – 2022-02-09 (×2): qty 90, 90d supply, fill #1
  Filled 2022-05-05: qty 90, 90d supply, fill #2
  Filled 2022-08-07: qty 90, 90d supply, fill #3

## 2021-11-11 ENCOUNTER — Other Ambulatory Visit (HOSPITAL_COMMUNITY): Payer: Self-pay

## 2021-11-17 ENCOUNTER — Other Ambulatory Visit (HOSPITAL_COMMUNITY): Payer: Self-pay

## 2021-11-18 ENCOUNTER — Other Ambulatory Visit (HOSPITAL_COMMUNITY): Payer: Self-pay

## 2021-11-19 ENCOUNTER — Other Ambulatory Visit (HOSPITAL_COMMUNITY): Payer: Self-pay

## 2021-11-21 ENCOUNTER — Other Ambulatory Visit (HOSPITAL_COMMUNITY): Payer: Self-pay

## 2021-12-15 DIAGNOSIS — L570 Actinic keratosis: Secondary | ICD-10-CM | POA: Diagnosis not present

## 2021-12-15 DIAGNOSIS — D1801 Hemangioma of skin and subcutaneous tissue: Secondary | ICD-10-CM | POA: Diagnosis not present

## 2021-12-15 DIAGNOSIS — D2271 Melanocytic nevi of right lower limb, including hip: Secondary | ICD-10-CM | POA: Diagnosis not present

## 2021-12-15 DIAGNOSIS — D2272 Melanocytic nevi of left lower limb, including hip: Secondary | ICD-10-CM | POA: Diagnosis not present

## 2021-12-15 DIAGNOSIS — D225 Melanocytic nevi of trunk: Secondary | ICD-10-CM | POA: Diagnosis not present

## 2021-12-15 DIAGNOSIS — L820 Inflamed seborrheic keratosis: Secondary | ICD-10-CM | POA: Diagnosis not present

## 2021-12-15 DIAGNOSIS — D2361 Other benign neoplasm of skin of right upper limb, including shoulder: Secondary | ICD-10-CM | POA: Diagnosis not present

## 2021-12-15 DIAGNOSIS — L821 Other seborrheic keratosis: Secondary | ICD-10-CM | POA: Diagnosis not present

## 2021-12-15 DIAGNOSIS — Z85828 Personal history of other malignant neoplasm of skin: Secondary | ICD-10-CM | POA: Diagnosis not present

## 2022-01-13 ENCOUNTER — Other Ambulatory Visit (HOSPITAL_COMMUNITY): Payer: Self-pay

## 2022-01-22 ENCOUNTER — Other Ambulatory Visit (HOSPITAL_COMMUNITY): Payer: Self-pay

## 2022-02-02 DIAGNOSIS — H52203 Unspecified astigmatism, bilateral: Secondary | ICD-10-CM | POA: Diagnosis not present

## 2022-02-02 DIAGNOSIS — E119 Type 2 diabetes mellitus without complications: Secondary | ICD-10-CM | POA: Diagnosis not present

## 2022-02-02 DIAGNOSIS — H2513 Age-related nuclear cataract, bilateral: Secondary | ICD-10-CM | POA: Diagnosis not present

## 2022-02-08 ENCOUNTER — Other Ambulatory Visit: Payer: Self-pay

## 2022-02-08 ENCOUNTER — Other Ambulatory Visit (HOSPITAL_COMMUNITY): Payer: Self-pay

## 2022-02-08 MED ORDER — VITAMIN D (ERGOCALCIFEROL) 1.25 MG (50000 UNIT) PO CAPS
50000.0000 [IU] | ORAL_CAPSULE | ORAL | 4 refills | Status: DC
  Filled 2022-02-08: qty 12, 84d supply, fill #0
  Filled 2022-05-05: qty 12, 84d supply, fill #1
  Filled 2022-08-07: qty 12, 84d supply, fill #2
  Filled 2022-11-08: qty 12, 84d supply, fill #3
  Filled 2023-02-02: qty 12, 84d supply, fill #4

## 2022-02-09 ENCOUNTER — Other Ambulatory Visit: Payer: Self-pay

## 2022-02-12 ENCOUNTER — Other Ambulatory Visit (HOSPITAL_COMMUNITY): Payer: Self-pay

## 2022-02-22 DIAGNOSIS — I10 Essential (primary) hypertension: Secondary | ICD-10-CM | POA: Diagnosis not present

## 2022-02-22 DIAGNOSIS — E1129 Type 2 diabetes mellitus with other diabetic kidney complication: Secondary | ICD-10-CM | POA: Diagnosis not present

## 2022-02-22 DIAGNOSIS — Z125 Encounter for screening for malignant neoplasm of prostate: Secondary | ICD-10-CM | POA: Diagnosis not present

## 2022-02-22 DIAGNOSIS — E041 Nontoxic single thyroid nodule: Secondary | ICD-10-CM | POA: Diagnosis not present

## 2022-02-22 DIAGNOSIS — E785 Hyperlipidemia, unspecified: Secondary | ICD-10-CM | POA: Diagnosis not present

## 2022-02-22 DIAGNOSIS — R7989 Other specified abnormal findings of blood chemistry: Secondary | ICD-10-CM | POA: Diagnosis not present

## 2022-02-22 DIAGNOSIS — E210 Primary hyperparathyroidism: Secondary | ICD-10-CM | POA: Diagnosis not present

## 2022-02-23 DIAGNOSIS — Z1212 Encounter for screening for malignant neoplasm of rectum: Secondary | ICD-10-CM | POA: Diagnosis not present

## 2022-02-26 ENCOUNTER — Other Ambulatory Visit (HOSPITAL_COMMUNITY): Payer: Self-pay

## 2022-03-08 ENCOUNTER — Other Ambulatory Visit (HOSPITAL_COMMUNITY): Payer: Self-pay

## 2022-03-08 MED ORDER — FINASTERIDE 5 MG PO TABS: 5.0000 mg | ORAL_TABLET | Freq: Every day | ORAL | 3 refills | Status: AC

## 2022-03-09 ENCOUNTER — Other Ambulatory Visit (HOSPITAL_COMMUNITY): Payer: Self-pay

## 2022-04-05 ENCOUNTER — Other Ambulatory Visit (HOSPITAL_COMMUNITY): Payer: Self-pay

## 2022-04-05 DIAGNOSIS — R3915 Urgency of urination: Secondary | ICD-10-CM | POA: Diagnosis not present

## 2022-04-05 MED ORDER — FINASTERIDE 5 MG PO TABS
5.0000 mg | ORAL_TABLET | Freq: Every day | ORAL | 3 refills | Status: DC
  Filled 2022-04-05: qty 90, 90d supply, fill #0
  Filled 2022-07-16: qty 90, 90d supply, fill #1
  Filled 2022-10-19: qty 90, 90d supply, fill #2
  Filled 2023-01-13: qty 90, 90d supply, fill #3

## 2022-04-10 ENCOUNTER — Other Ambulatory Visit (HOSPITAL_COMMUNITY): Payer: Self-pay

## 2022-05-05 ENCOUNTER — Other Ambulatory Visit (HOSPITAL_COMMUNITY): Payer: Self-pay

## 2022-05-06 ENCOUNTER — Other Ambulatory Visit (HOSPITAL_COMMUNITY): Payer: Self-pay

## 2022-05-06 MED ORDER — AMLODIPINE BESYLATE 2.5 MG PO TABS
2.5000 mg | ORAL_TABLET | Freq: Every day | ORAL | 3 refills | Status: DC
  Filled 2022-05-06: qty 90, 90d supply, fill #0
  Filled 2022-08-07: qty 90, 90d supply, fill #1
  Filled 2022-11-08: qty 90, 90d supply, fill #2
  Filled 2023-02-02: qty 90, 90d supply, fill #3

## 2022-05-31 DIAGNOSIS — Z85828 Personal history of other malignant neoplasm of skin: Secondary | ICD-10-CM | POA: Diagnosis not present

## 2022-05-31 DIAGNOSIS — D2372 Other benign neoplasm of skin of left lower limb, including hip: Secondary | ICD-10-CM | POA: Diagnosis not present

## 2022-05-31 DIAGNOSIS — L570 Actinic keratosis: Secondary | ICD-10-CM | POA: Diagnosis not present

## 2022-05-31 DIAGNOSIS — L814 Other melanin hyperpigmentation: Secondary | ICD-10-CM | POA: Diagnosis not present

## 2022-05-31 DIAGNOSIS — D1801 Hemangioma of skin and subcutaneous tissue: Secondary | ICD-10-CM | POA: Diagnosis not present

## 2022-05-31 DIAGNOSIS — D225 Melanocytic nevi of trunk: Secondary | ICD-10-CM | POA: Diagnosis not present

## 2022-05-31 DIAGNOSIS — L821 Other seborrheic keratosis: Secondary | ICD-10-CM | POA: Diagnosis not present

## 2022-05-31 DIAGNOSIS — D692 Other nonthrombocytopenic purpura: Secondary | ICD-10-CM | POA: Diagnosis not present

## 2022-07-01 ENCOUNTER — Other Ambulatory Visit (HOSPITAL_COMMUNITY): Payer: Self-pay

## 2022-07-17 ENCOUNTER — Other Ambulatory Visit (HOSPITAL_COMMUNITY): Payer: Self-pay

## 2022-07-19 ENCOUNTER — Other Ambulatory Visit (HOSPITAL_COMMUNITY): Payer: Self-pay

## 2022-08-07 ENCOUNTER — Other Ambulatory Visit (HOSPITAL_COMMUNITY): Payer: Self-pay

## 2022-08-10 DIAGNOSIS — E785 Hyperlipidemia, unspecified: Secondary | ICD-10-CM | POA: Diagnosis not present

## 2022-08-10 DIAGNOSIS — I129 Hypertensive chronic kidney disease with stage 1 through stage 4 chronic kidney disease, or unspecified chronic kidney disease: Secondary | ICD-10-CM | POA: Diagnosis not present

## 2022-08-10 DIAGNOSIS — N182 Chronic kidney disease, stage 2 (mild): Secondary | ICD-10-CM | POA: Diagnosis not present

## 2022-08-10 DIAGNOSIS — E1129 Type 2 diabetes mellitus with other diabetic kidney complication: Secondary | ICD-10-CM | POA: Diagnosis not present

## 2022-10-19 ENCOUNTER — Other Ambulatory Visit (HOSPITAL_COMMUNITY): Payer: Self-pay

## 2022-10-20 ENCOUNTER — Other Ambulatory Visit (HOSPITAL_COMMUNITY): Payer: Self-pay

## 2022-10-23 DIAGNOSIS — Z23 Encounter for immunization: Secondary | ICD-10-CM | POA: Diagnosis not present

## 2022-11-02 ENCOUNTER — Other Ambulatory Visit (HOSPITAL_COMMUNITY): Payer: Self-pay

## 2022-11-02 MED ORDER — AMOXICILLIN 500 MG PO CAPS
ORAL_CAPSULE | ORAL | 1 refills | Status: DC
  Filled 2022-11-02: qty 30, 29d supply, fill #0
  Filled 2023-01-13: qty 30, 29d supply, fill #1

## 2022-11-08 ENCOUNTER — Other Ambulatory Visit (HOSPITAL_COMMUNITY): Payer: Self-pay

## 2022-11-08 MED ORDER — TELMISARTAN 80 MG PO TABS
80.0000 mg | ORAL_TABLET | Freq: Every day | ORAL | 3 refills | Status: DC
  Filled 2022-11-08: qty 90, 90d supply, fill #0
  Filled 2023-02-02: qty 90, 90d supply, fill #1
  Filled 2023-05-03: qty 90, 90d supply, fill #2
  Filled 2023-08-03: qty 90, 90d supply, fill #3

## 2022-11-08 MED ORDER — PIOGLITAZONE HCL 30 MG PO TABS
30.0000 mg | ORAL_TABLET | Freq: Every day | ORAL | 3 refills | Status: DC
  Filled 2022-11-08: qty 90, 90d supply, fill #0
  Filled 2023-02-02: qty 90, 90d supply, fill #1
  Filled 2023-05-03: qty 90, 90d supply, fill #2
  Filled 2023-08-03: qty 90, 90d supply, fill #3

## 2022-11-09 ENCOUNTER — Other Ambulatory Visit: Payer: Self-pay

## 2022-11-09 ENCOUNTER — Other Ambulatory Visit (HOSPITAL_COMMUNITY): Payer: Self-pay

## 2022-12-01 DIAGNOSIS — D225 Melanocytic nevi of trunk: Secondary | ICD-10-CM | POA: Diagnosis not present

## 2022-12-01 DIAGNOSIS — D1801 Hemangioma of skin and subcutaneous tissue: Secondary | ICD-10-CM | POA: Diagnosis not present

## 2022-12-01 DIAGNOSIS — L905 Scar conditions and fibrosis of skin: Secondary | ICD-10-CM | POA: Diagnosis not present

## 2022-12-01 DIAGNOSIS — L570 Actinic keratosis: Secondary | ICD-10-CM | POA: Diagnosis not present

## 2022-12-01 DIAGNOSIS — L821 Other seborrheic keratosis: Secondary | ICD-10-CM | POA: Diagnosis not present

## 2022-12-01 DIAGNOSIS — D2239 Melanocytic nevi of other parts of face: Secondary | ICD-10-CM | POA: Diagnosis not present

## 2022-12-01 DIAGNOSIS — Z85828 Personal history of other malignant neoplasm of skin: Secondary | ICD-10-CM | POA: Diagnosis not present

## 2022-12-01 DIAGNOSIS — D2271 Melanocytic nevi of right lower limb, including hip: Secondary | ICD-10-CM | POA: Diagnosis not present

## 2022-12-01 DIAGNOSIS — D485 Neoplasm of uncertain behavior of skin: Secondary | ICD-10-CM | POA: Diagnosis not present

## 2022-12-01 DIAGNOSIS — D2272 Melanocytic nevi of left lower limb, including hip: Secondary | ICD-10-CM | POA: Diagnosis not present

## 2023-01-13 ENCOUNTER — Other Ambulatory Visit (HOSPITAL_COMMUNITY): Payer: Self-pay

## 2023-02-02 ENCOUNTER — Other Ambulatory Visit (HOSPITAL_COMMUNITY): Payer: Self-pay

## 2023-02-03 ENCOUNTER — Other Ambulatory Visit (HOSPITAL_COMMUNITY): Payer: Self-pay

## 2023-02-03 ENCOUNTER — Other Ambulatory Visit: Payer: Self-pay

## 2023-02-03 MED ORDER — EZETIMIBE-SIMVASTATIN 10-20 MG PO TABS
1.0000 | ORAL_TABLET | Freq: Every day | ORAL | 4 refills | Status: AC
  Filled 2023-02-03: qty 90, 90d supply, fill #0
  Filled 2023-05-03: qty 90, 90d supply, fill #1
  Filled 2023-08-03: qty 90, 90d supply, fill #2
  Filled 2023-10-27: qty 90, 90d supply, fill #3
  Filled 2024-01-28: qty 90, 90d supply, fill #4

## 2023-02-22 DIAGNOSIS — H52203 Unspecified astigmatism, bilateral: Secondary | ICD-10-CM | POA: Diagnosis not present

## 2023-03-01 ENCOUNTER — Other Ambulatory Visit (HOSPITAL_COMMUNITY): Payer: Self-pay

## 2023-03-01 MED ORDER — AMOXICILLIN 500 MG PO CAPS
ORAL_CAPSULE | ORAL | 0 refills | Status: DC
  Filled 2023-03-01: qty 30, 7d supply, fill #0

## 2023-03-11 DIAGNOSIS — Z1212 Encounter for screening for malignant neoplasm of rectum: Secondary | ICD-10-CM | POA: Diagnosis not present

## 2023-03-11 DIAGNOSIS — N182 Chronic kidney disease, stage 2 (mild): Secondary | ICD-10-CM | POA: Diagnosis not present

## 2023-03-11 DIAGNOSIS — I129 Hypertensive chronic kidney disease with stage 1 through stage 4 chronic kidney disease, or unspecified chronic kidney disease: Secondary | ICD-10-CM | POA: Diagnosis not present

## 2023-03-11 DIAGNOSIS — E1129 Type 2 diabetes mellitus with other diabetic kidney complication: Secondary | ICD-10-CM | POA: Diagnosis not present

## 2023-03-11 DIAGNOSIS — E039 Hypothyroidism, unspecified: Secondary | ICD-10-CM | POA: Diagnosis not present

## 2023-03-11 DIAGNOSIS — E785 Hyperlipidemia, unspecified: Secondary | ICD-10-CM | POA: Diagnosis not present

## 2023-03-14 ENCOUNTER — Other Ambulatory Visit (HOSPITAL_COMMUNITY): Payer: Self-pay

## 2023-03-14 ENCOUNTER — Other Ambulatory Visit: Payer: Self-pay

## 2023-03-14 DIAGNOSIS — E041 Nontoxic single thyroid nodule: Secondary | ICD-10-CM | POA: Diagnosis not present

## 2023-03-14 DIAGNOSIS — E785 Hyperlipidemia, unspecified: Secondary | ICD-10-CM | POA: Diagnosis not present

## 2023-03-14 DIAGNOSIS — Z23 Encounter for immunization: Secondary | ICD-10-CM | POA: Diagnosis not present

## 2023-03-14 DIAGNOSIS — E119 Type 2 diabetes mellitus without complications: Secondary | ICD-10-CM | POA: Diagnosis not present

## 2023-03-14 DIAGNOSIS — I10 Essential (primary) hypertension: Secondary | ICD-10-CM | POA: Diagnosis not present

## 2023-03-14 DIAGNOSIS — Z0000 Encounter for general adult medical examination without abnormal findings: Secondary | ICD-10-CM | POA: Diagnosis not present

## 2023-03-14 DIAGNOSIS — R051 Acute cough: Secondary | ICD-10-CM | POA: Diagnosis not present

## 2023-03-14 MED ORDER — ACETAMINOPHEN-CODEINE 300-15 MG PO TABS
1.0000 | ORAL_TABLET | Freq: Four times a day (QID) | ORAL | 0 refills | Status: AC | PRN
  Filled 2023-03-14: qty 20, 5d supply, fill #0

## 2023-03-15 ENCOUNTER — Other Ambulatory Visit: Payer: Self-pay

## 2023-03-15 ENCOUNTER — Other Ambulatory Visit (HOSPITAL_COMMUNITY): Payer: Self-pay

## 2023-03-21 DIAGNOSIS — H903 Sensorineural hearing loss, bilateral: Secondary | ICD-10-CM | POA: Diagnosis not present

## 2023-03-23 DIAGNOSIS — H903 Sensorineural hearing loss, bilateral: Secondary | ICD-10-CM | POA: Diagnosis not present

## 2023-03-28 ENCOUNTER — Other Ambulatory Visit (HOSPITAL_COMMUNITY): Payer: Self-pay

## 2023-03-28 DIAGNOSIS — N401 Enlarged prostate with lower urinary tract symptoms: Secondary | ICD-10-CM | POA: Diagnosis not present

## 2023-03-28 DIAGNOSIS — R3915 Urgency of urination: Secondary | ICD-10-CM | POA: Diagnosis not present

## 2023-03-28 MED ORDER — FINASTERIDE 5 MG PO TABS
5.0000 mg | ORAL_TABLET | Freq: Every day | ORAL | 3 refills | Status: AC
  Filled 2023-03-28: qty 90, 90d supply, fill #0
  Filled 2023-07-15: qty 90, 90d supply, fill #1
  Filled 2023-10-01: qty 90, 90d supply, fill #2
  Filled 2023-12-30: qty 90, 90d supply, fill #3

## 2023-04-15 ENCOUNTER — Other Ambulatory Visit (HOSPITAL_COMMUNITY): Payer: Self-pay

## 2023-04-15 MED ORDER — FLUTICASONE PROPIONATE 50 MCG/ACT NA SUSP
1.0000 | Freq: Two times a day (BID) | NASAL | 3 refills | Status: AC
  Filled 2023-04-15: qty 16, 30d supply, fill #0

## 2023-04-15 MED ORDER — AMOXICILLIN 500 MG PO CAPS
500.0000 mg | ORAL_CAPSULE | Freq: Three times a day (TID) | ORAL | 0 refills | Status: AC
  Filled 2023-04-15: qty 15, 5d supply, fill #0

## 2023-05-03 ENCOUNTER — Other Ambulatory Visit: Payer: Self-pay

## 2023-05-03 ENCOUNTER — Other Ambulatory Visit (HOSPITAL_COMMUNITY): Payer: Self-pay

## 2023-05-03 MED ORDER — VITAMIN D (ERGOCALCIFEROL) 1.25 MG (50000 UNIT) PO CAPS
50000.0000 [IU] | ORAL_CAPSULE | ORAL | 1 refills | Status: DC
  Filled 2023-05-03: qty 12, 84d supply, fill #0
  Filled 2023-08-03: qty 12, 84d supply, fill #1

## 2023-05-03 MED ORDER — AMLODIPINE BESYLATE 2.5 MG PO TABS
2.5000 mg | ORAL_TABLET | Freq: Every day | ORAL | 1 refills | Status: DC
  Filled 2023-05-03: qty 90, 90d supply, fill #0
  Filled 2023-08-03: qty 90, 90d supply, fill #1

## 2023-05-26 DIAGNOSIS — Z85828 Personal history of other malignant neoplasm of skin: Secondary | ICD-10-CM | POA: Diagnosis not present

## 2023-05-26 DIAGNOSIS — L905 Scar conditions and fibrosis of skin: Secondary | ICD-10-CM | POA: Diagnosis not present

## 2023-05-26 DIAGNOSIS — D2272 Melanocytic nevi of left lower limb, including hip: Secondary | ICD-10-CM | POA: Diagnosis not present

## 2023-05-26 DIAGNOSIS — D2271 Melanocytic nevi of right lower limb, including hip: Secondary | ICD-10-CM | POA: Diagnosis not present

## 2023-05-26 DIAGNOSIS — L821 Other seborrheic keratosis: Secondary | ICD-10-CM | POA: Diagnosis not present

## 2023-05-26 DIAGNOSIS — D225 Melanocytic nevi of trunk: Secondary | ICD-10-CM | POA: Diagnosis not present

## 2023-05-26 DIAGNOSIS — L570 Actinic keratosis: Secondary | ICD-10-CM | POA: Diagnosis not present

## 2023-07-15 ENCOUNTER — Other Ambulatory Visit (HOSPITAL_COMMUNITY): Payer: Self-pay

## 2023-07-18 ENCOUNTER — Other Ambulatory Visit (HOSPITAL_COMMUNITY): Payer: Self-pay

## 2023-08-03 ENCOUNTER — Other Ambulatory Visit (HOSPITAL_COMMUNITY): Payer: Self-pay

## 2023-09-21 DIAGNOSIS — H903 Sensorineural hearing loss, bilateral: Secondary | ICD-10-CM | POA: Diagnosis not present

## 2023-09-23 DIAGNOSIS — I10 Essential (primary) hypertension: Secondary | ICD-10-CM | POA: Diagnosis not present

## 2023-09-23 DIAGNOSIS — E119 Type 2 diabetes mellitus without complications: Secondary | ICD-10-CM | POA: Diagnosis not present

## 2023-09-23 DIAGNOSIS — E785 Hyperlipidemia, unspecified: Secondary | ICD-10-CM | POA: Diagnosis not present

## 2023-10-01 ENCOUNTER — Other Ambulatory Visit (HOSPITAL_COMMUNITY): Payer: Self-pay

## 2023-10-22 DIAGNOSIS — Z23 Encounter for immunization: Secondary | ICD-10-CM | POA: Diagnosis not present

## 2023-10-27 ENCOUNTER — Other Ambulatory Visit (HOSPITAL_COMMUNITY): Payer: Self-pay

## 2023-10-28 ENCOUNTER — Other Ambulatory Visit (HOSPITAL_COMMUNITY): Payer: Self-pay

## 2023-10-28 ENCOUNTER — Other Ambulatory Visit: Payer: Self-pay

## 2023-10-28 MED ORDER — PIOGLITAZONE HCL 30 MG PO TABS
30.0000 mg | ORAL_TABLET | Freq: Every day | ORAL | 3 refills | Status: AC
  Filled 2023-10-28: qty 90, 90d supply, fill #0
  Filled 2024-01-28: qty 90, 90d supply, fill #1

## 2023-10-28 MED ORDER — TELMISARTAN 80 MG PO TABS
80.0000 mg | ORAL_TABLET | Freq: Every day | ORAL | 3 refills | Status: AC
  Filled 2023-10-28: qty 90, 90d supply, fill #0
  Filled 2024-01-28: qty 90, 90d supply, fill #1

## 2023-10-28 MED ORDER — AMLODIPINE BESYLATE 2.5 MG PO TABS
2.5000 mg | ORAL_TABLET | Freq: Every day | ORAL | 3 refills | Status: AC
  Filled 2023-10-28: qty 90, 90d supply, fill #0
  Filled 2024-01-28: qty 90, 90d supply, fill #1

## 2023-10-28 MED ORDER — VITAMIN D (ERGOCALCIFEROL) 1.25 MG (50000 UNIT) PO CAPS
50000.0000 [IU] | ORAL_CAPSULE | ORAL | 3 refills | Status: AC
  Filled 2023-10-28: qty 12, 84d supply, fill #0
  Filled 2024-01-28: qty 12, 84d supply, fill #1

## 2023-11-25 ENCOUNTER — Other Ambulatory Visit (HOSPITAL_COMMUNITY): Payer: Self-pay

## 2023-11-28 DIAGNOSIS — D485 Neoplasm of uncertain behavior of skin: Secondary | ICD-10-CM | POA: Diagnosis not present

## 2023-11-28 DIAGNOSIS — L905 Scar conditions and fibrosis of skin: Secondary | ICD-10-CM | POA: Diagnosis not present

## 2023-11-28 DIAGNOSIS — D225 Melanocytic nevi of trunk: Secondary | ICD-10-CM | POA: Diagnosis not present

## 2023-11-28 DIAGNOSIS — D2261 Melanocytic nevi of right upper limb, including shoulder: Secondary | ICD-10-CM | POA: Diagnosis not present

## 2023-11-28 DIAGNOSIS — D2372 Other benign neoplasm of skin of left lower limb, including hip: Secondary | ICD-10-CM | POA: Diagnosis not present

## 2023-11-28 DIAGNOSIS — L570 Actinic keratosis: Secondary | ICD-10-CM | POA: Diagnosis not present

## 2023-11-28 DIAGNOSIS — D2262 Melanocytic nevi of left upper limb, including shoulder: Secondary | ICD-10-CM | POA: Diagnosis not present

## 2023-11-28 DIAGNOSIS — D692 Other nonthrombocytopenic purpura: Secondary | ICD-10-CM | POA: Diagnosis not present

## 2023-11-28 DIAGNOSIS — L821 Other seborrheic keratosis: Secondary | ICD-10-CM | POA: Diagnosis not present

## 2023-11-28 DIAGNOSIS — C44619 Basal cell carcinoma of skin of left upper limb, including shoulder: Secondary | ICD-10-CM | POA: Diagnosis not present

## 2023-11-28 DIAGNOSIS — Z85828 Personal history of other malignant neoplasm of skin: Secondary | ICD-10-CM | POA: Diagnosis not present

## 2023-12-30 ENCOUNTER — Other Ambulatory Visit (HOSPITAL_COMMUNITY): Payer: Self-pay

## 2024-01-20 ENCOUNTER — Other Ambulatory Visit (HOSPITAL_COMMUNITY): Payer: Self-pay

## 2024-01-28 ENCOUNTER — Other Ambulatory Visit (HOSPITAL_COMMUNITY): Payer: Self-pay
